# Patient Record
Sex: Male | Born: 1980 | Race: Black or African American | Hispanic: No | Marital: Single | Smoking: Current every day smoker
Health system: Southern US, Community
[De-identification: ages and names within clinical notes are randomized; demographics above are authoritative.]

## PROBLEM LIST (undated history)

## (undated) DIAGNOSIS — Z789 Other specified health status: Secondary | ICD-10-CM

## (undated) DIAGNOSIS — M5430 Sciatica, unspecified side: Secondary | ICD-10-CM

## (undated) HISTORY — DX: Other specified health status: Z78.9

## (undated) HISTORY — PX: WRIST SURGERY: SHX841

---

## 2000-03-22 ENCOUNTER — Emergency Department (HOSPITAL_COMMUNITY): Admission: EM | Admit: 2000-03-22 | Discharge: 2000-03-22 | Payer: Self-pay | Admitting: *Deleted

## 2000-03-22 ENCOUNTER — Encounter: Payer: Self-pay | Admitting: Emergency Medicine

## 2002-09-16 ENCOUNTER — Encounter: Payer: Self-pay | Admitting: *Deleted

## 2002-09-16 ENCOUNTER — Emergency Department (HOSPITAL_COMMUNITY): Admission: EM | Admit: 2002-09-16 | Discharge: 2002-09-16 | Payer: Self-pay | Admitting: *Deleted

## 2010-09-16 ENCOUNTER — Emergency Department (HOSPITAL_COMMUNITY)
Admission: EM | Admit: 2010-09-16 | Discharge: 2010-09-16 | Payer: Self-pay | Source: Home / Self Care | Admitting: Emergency Medicine

## 2010-09-23 LAB — GC/CHLAMYDIA PROBE AMP, GENITAL
Chlamydia, DNA Probe: NEGATIVE
GC Probe Amp, Genital: NEGATIVE

## 2014-09-13 ENCOUNTER — Encounter (HOSPITAL_COMMUNITY): Payer: Self-pay | Admitting: *Deleted

## 2014-09-13 ENCOUNTER — Emergency Department (HOSPITAL_COMMUNITY)
Admission: EM | Admit: 2014-09-13 | Discharge: 2014-09-14 | Disposition: A | Discharge status: Home / Self Care | Payer: Self-pay | Attending: Emergency Medicine | Admitting: Emergency Medicine

## 2014-09-13 DIAGNOSIS — Z720 Tobacco use: Secondary | ICD-10-CM | POA: Insufficient documentation

## 2014-09-13 DIAGNOSIS — N342 Other urethritis: Secondary | ICD-10-CM | POA: Insufficient documentation

## 2014-09-13 MED ORDER — CEFTRIAXONE SODIUM 250 MG IJ SOLR
250.0000 mg | Freq: Once | INTRAMUSCULAR | Status: AC
  Administered 2014-09-13: 250 mg via INTRAMUSCULAR
  Filled 2014-09-13: qty 250

## 2014-09-13 MED ORDER — DOXYCYCLINE HYCLATE 100 MG PO CAPS: 100.0000 mg | ORAL_CAPSULE | Freq: Two times a day (BID) | ORAL | Status: DC

## 2014-09-13 NOTE — ED Notes (Signed)
Pt states his partner states she has a STD and now he is having a discharge and burning with urination.

## 2014-09-13 NOTE — Discharge Instructions (Signed)

## 2014-09-13 NOTE — ED Provider Notes (Signed)
CSN: [id number]     Arrival date & time 09/13/14  2231 History  This chart was scribed for Loren Raceravid Kambrey Hagger, MD by Bronson CurbJacqueline Melvin, ED Scribe. This patient was seen in room APA07/APA07 and the patient's care was started at 11:38 PM.     Chief Complaint  Patient presents with  . Exposure to STD   The history is provided by the patient. No language interpreter was used.     HPI Comments: Taylor Mcclain is a 34 y.o. male, with no significant medical history, who presents to the Emergency Department for possible exposure to STD. Patient states he was sexually active with someone who was recently diagnosed with an STD. There is associated dysuria and penile discharge that has been ongoing for the past 5 days. He also notes mild right inguinal pain. No testicular pain or swelling. No fever or chills. He denies any other symptoms.  History reviewed. No pertinent past medical history. Past Surgical History  Procedure Laterality Date  . Orthopedic surgery     History reviewed. No pertinent family history. History  Substance Use Topics  . Smoking status: Current Every Day Smoker -- 0.50 packs/day  . Smokeless tobacco: Not on file  . Alcohol Use: Yes    Review of Systems  Constitutional: Negative for fever and chills.  Gastrointestinal: Negative for nausea, vomiting and abdominal pain.  Genitourinary: Positive for dysuria and discharge. Negative for frequency, hematuria, flank pain, penile swelling, scrotal swelling, penile pain and testicular pain.  Musculoskeletal: Negative for back pain.  All other systems reviewed and are negative.     Allergies  Review of patient's allergies indicates no known allergies.  Home Medications   Prior to Admission medications   Not on File   Triage Vitals: BP 112/67 mmHg  Pulse 79  Temp(Src) 98.7 F (37.1 C) (Oral)  Resp 18  Ht 5\' 11"  (1.803 m)  Wt 180 lb (81.647 kg)  BMI 25.12 kg/m2  SpO2 100%  Physical Exam  Constitutional: He is oriented  to person, place, and time. He appears well-developed and well-nourished. No distress.  HENT:  Head: Normocephalic and atraumatic.  Eyes: Conjunctivae and EOM are normal.  Neck: Neck supple. No tracheal deviation present.  Cardiovascular: Normal rate.   Pulmonary/Chest: Effort normal. No respiratory distress.  Abdominal: He exhibits no distension and no mass. There is no tenderness. There is no rebound and no guarding.  Genitourinary:  Right inguinal lymphadenopathy. Milky penile discharge. No testicular swelling or tenderness.  Musculoskeletal: Normal range of motion.  Neurological: He is alert and oriented to person, place, and time.  Skin: Skin is warm and dry.  Psychiatric: He has a normal mood and affect. His behavior is normal.  Nursing note and vitals reviewed.   ED Course  Procedures (including critical care time)  DIAGNOSTIC STUDIES: Oxygen Saturation is 100% on room air, normal by my interpretation.    COORDINATION OF CARE: At 2341 Discussed treatment plan with patient which includes ABX. Patient agrees.   Labs Review Labs Reviewed - No data to display  Imaging Review No results found.   EKG Interpretation None      MDM   Final diagnoses:  None   I personally performed the services described in this documentation, which was scribed in my presence. The recorded information has been reviewed and is accurate.  Patient has been advised to have all of his partners evaluated and treated. He has given return precautions and voiced understanding.   Loren Raceravid Saranne Crislip, MD 09/13/14  2359 

## 2014-12-17 ENCOUNTER — Encounter (HOSPITAL_COMMUNITY): Payer: Self-pay | Admitting: Emergency Medicine

## 2014-12-17 ENCOUNTER — Emergency Department (HOSPITAL_COMMUNITY): Payer: Self-pay

## 2014-12-17 ENCOUNTER — Emergency Department (HOSPITAL_COMMUNITY)
Admission: EM | Admit: 2014-12-17 | Discharge: 2014-12-17 | Disposition: A | Discharge status: Home / Self Care | Payer: Self-pay | Attending: Emergency Medicine | Admitting: Emergency Medicine

## 2014-12-17 DIAGNOSIS — IMO0002 Reserved for concepts with insufficient information to code with codable children: Secondary | ICD-10-CM

## 2014-12-17 DIAGNOSIS — S0990XA Unspecified injury of head, initial encounter: Secondary | ICD-10-CM

## 2014-12-17 DIAGNOSIS — S0101XA Laceration without foreign body of scalp, initial encounter: Secondary | ICD-10-CM | POA: Insufficient documentation

## 2014-12-17 DIAGNOSIS — T148XXA Other injury of unspecified body region, initial encounter: Secondary | ICD-10-CM

## 2014-12-17 DIAGNOSIS — Y998 Other external cause status: Secondary | ICD-10-CM | POA: Insufficient documentation

## 2014-12-17 DIAGNOSIS — Y9289 Other specified places as the place of occurrence of the external cause: Secondary | ICD-10-CM | POA: Insufficient documentation

## 2014-12-17 DIAGNOSIS — S60511A Abrasion of right hand, initial encounter: Secondary | ICD-10-CM | POA: Insufficient documentation

## 2014-12-17 DIAGNOSIS — T1490XA Injury, unspecified, initial encounter: Secondary | ICD-10-CM

## 2014-12-17 DIAGNOSIS — Z720 Tobacco use: Secondary | ICD-10-CM | POA: Insufficient documentation

## 2014-12-17 DIAGNOSIS — Y9389 Activity, other specified: Secondary | ICD-10-CM | POA: Insufficient documentation

## 2014-12-17 MED ORDER — OXYCODONE-ACETAMINOPHEN 5-325 MG PO TABS
1.0000 | ORAL_TABLET | Freq: Once | ORAL | Status: AC
  Administered 2014-12-17: 1 via ORAL
  Filled 2014-12-17: qty 1

## 2014-12-17 MED ORDER — HYDROCODONE-ACETAMINOPHEN 5-325 MG PO TABS: 2.0000 | ORAL_TABLET | ORAL | Status: DC | PRN

## 2014-12-17 MED ORDER — LIDOCAINE-EPINEPHRINE (PF) 2 %-1:200000 IJ SOLN
10.0000 mL | Freq: Once | INTRAMUSCULAR | Status: DC
  Filled 2014-12-17: qty 20
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
## 2014-12-17 NOTE — ED Notes (Signed)
Suture cart ready
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
## 2014-12-17 NOTE — Discharge Instructions (Signed)
Follow-up at your doctor's office, urgent care, or if necessary, this ER in 10 days for removal of staples.  Abrasion An abrasion is a cut or scrape of the skin. Abrasions do not extend through all layers of the skin and most heal within 10 days. It is important to care for your abrasion properly to prevent infection. CAUSES  Most abrasions are caused by falling on, or gliding across, the ground or other surface. When your skin rubs on something, the outer and inner layer of skin rubs off, causing an abrasion. DIAGNOSIS  Your caregiver will be able to diagnose an abrasion during a physical exam.  TREATMENT  Your treatment depends on how large and deep the abrasion is. Generally, your abrasion will be cleaned with water and a mild soap to remove any dirt or debris. An antibiotic ointment may be put over the abrasion to prevent an infection. A bandage (dressing) may be wrapped around the abrasion to keep it from getting dirty.  You may need a tetanus shot if:  You cannot remember when you had your last tetanus shot.  You have never had a tetanus shot.  The injury broke your skin. If you get a tetanus shot, your arm may swell, get red, and feel warm to the touch. This is common and not a problem. If you need a tetanus shot and you choose not to have one, there is a rare chance of getting tetanus. Sickness from tetanus can be serious.  HOME CARE INSTRUCTIONS   If a dressing was applied, change it at least once a day or as directed by your caregiver. If the bandage sticks, soak it off with warm water.   Wash the area with water and a mild soap to remove all the ointment 2 times a day. Rinse off the soap and pat the area dry with a clean towel.   Reapply any ointment as directed by your caregiver. This will help prevent infection and keep the bandage from sticking. Use gauze over the wound and under the dressing to help keep the bandage from sticking.   Change your dressing right away if it  becomes wet or dirty.   Only take over-the-counter or prescription medicines for pain, discomfort, or fever as directed by your caregiver.   Follow up with your caregiver within 24-48 hours for a wound check, or as directed. If you were not given a wound-check appointment, look closely at your abrasion for redness, swelling, or pus. These are signs of infection. SEEK IMMEDIATE MEDICAL CARE IF:   You have increasing pain in the wound.   You have redness, swelling, or tenderness around the wound.   You have pus coming from the wound.   You have a fever or persistent symptoms for more than 2-3 days.  You have a fever and your symptoms suddenly get worse.  You have a bad smell coming from the wound or dressing.  MAKE SURE YOU:   Understand these instructions.  Will watch your condition.  Will get help right away if you are not doing well or get worse. Document Released: 06/04/2005 Document Revised: 08/11/2012 Document Reviewed: 07/29/2011 [facility] Patient Information 2015 [place], [place]. This information is not intended to replace advice given to you by your health care provider. Make sure you discuss any questions you have with your health care provider.  Head Injury You have a head injury. Headaches and throwing up (vomiting) are common after a head injury. It should be easy to wake up  from sleeping. Sometimes you must stay in the hospital. Most problems happen within the first 24 hours. Side effects may occur up to 7-10 days after the injury.  WHAT ARE THE TYPES OF HEAD INJURIES? Head injuries can be as minor as a bump. Some head injuries can be more severe. More severe head injuries include:  A jarring injury to the brain (concussion).  A bruise of the brain (contusion). This mean there is bleeding in the brain that can cause swelling.  A cracked skull (skull fracture).  Bleeding in the brain that collects, clots, and forms a bump (hematoma). WHEN SHOULD I GET HELP  RIGHT AWAY?   You are confused or sleepy.  You cannot be woken up.  You feel sick to your stomach (nauseous) or keep throwing up (vomiting).  Your dizziness or unsteadiness is getting worse.  You have very bad, lasting headaches that are not helped by medicine. Take medicines only as told by your doctor.  You cannot use your arms or legs like normal.  You cannot walk.  You notice changes in the black spots in the center of the colored part of your eye (pupil).  You have clear or bloody fluid coming from your nose or ears.  You have trouble seeing. During the next 24 hours after the injury, you must stay with someone who can watch you. This person should get help right away (call 911 in the [place] if you start to shake and are not able to control it (have seizures), you pass out, or you are unable to wake up. HOW CAN I PREVENT A HEAD INJURY IN THE FUTURE?  Wear seat belts.  Wear a helmet while bike riding and playing sports like football.  Stay away from dangerous activities around the house. WHEN CAN I RETURN TO NORMAL ACTIVITIES AND ATHLETICS? See your doctor before doing these activities. You should not do normal activities or play contact sports until 1 week after the following symptoms have stopped:  Headache that does not go away.  Dizziness.  Poor attention.  Confusion.  Memory problems.  Sickness to your stomach or throwing up.  Tiredness.  Fussiness.  Bothered by bright lights or loud noises.  Anxiousness or depression.  Restless sleep. MAKE SURE YOU:   Understand these instructions.  Will watch your condition.  Will get help right away if you are not doing well or get worse. Document Released: 08/07/2008 Document Revised: 01/09/2014 Document Reviewed: 05/02/2013 [facility] Patient Information 2015 [place], [place]. This information is not intended to replace advice given to you by your health care provider. Make sure you discuss any questions you have  with your health care provider.  Laceration Care, Adult A laceration is a cut that goes through all layers of the skin. The cut goes into the tissue beneath the skin. HOME CARE For stitches (sutures) or staples:  Keep the cut clean and dry.  If you have a bandage (dressing), change it at least once a day. Change the bandage if it gets wet or dirty, or as told by your doctor.  Wash the cut with soap and water 2 times a day. Rinse the cut with water. Pat it dry with a clean towel.  Put a thin layer of medicated cream on the cut as told by your doctor.  You may shower after the first 24 hours. Do not soak the cut in water until the stitches are removed.  Only take medicines as told by your doctor.  Have your stitches  or staples removed as told by your doctor. For skin adhesive strips:  Keep the cut clean and dry.  Do not get the strips wet. You may take a bath, but be careful to keep the cut dry.  If the cut gets wet, pat it dry with a clean towel.  The strips will fall off on their own. Do not remove the strips that are still stuck to the cut. For wound glue:  You may shower or take baths. Do not soak or scrub the cut. Do not swim. Avoid heavy sweating until the glue falls off on its own. After a shower or bath, pat the cut dry with a clean towel.  Do not put medicine on your cut until the glue falls off.  If you have a bandage, do not put tape over the glue.  Avoid lots of sunlight or tanning lamps until the glue falls off. Put sunscreen on the cut for the first year to reduce your scar.  The glue will fall off on its own. Do not pick at the glue. You may need a tetanus shot if:  You cannot remember when you had your last tetanus shot.  You have never had a tetanus shot. If you need a tetanus shot and you choose not to have one, you may get tetanus. Sickness from tetanus can be serious. GET HELP RIGHT AWAY IF:   Your pain does not get better with medicine.  Your arm,  hand, leg, or foot loses feeling (numbness) or changes color.  Your cut is bleeding.  Your joint feels weak, or you cannot use your joint.  You have painful lumps on your body.  Your cut is red, puffy (swollen), or painful.  You have a red line on the skin near the cut.  You have yellowish-white fluid (pus) coming from the cut.  You have a fever.  You have a bad smell coming from the cut or bandage.  Your cut breaks open before or after stitches are removed.  You notice something coming out of the cut, such as wood or glass.  You cannot move a finger or toe. MAKE SURE YOU:   Understand these instructions.  Will watch your condition.  Will get help right away if you are not doing well or get worse. Document Released: 02/11/2008 Document Revised: 11/17/2011 Document Reviewed: 02/18/2011 [facility] Patient Information 2015 [place], [place]. This information is not intended to replace advice given to you by your health care provider. Make sure you discuss any questions you have with your health care provider.

## 2014-12-27 ENCOUNTER — Emergency Department (HOSPITAL_COMMUNITY)
Admission: EM | Admit: 2014-12-27 | Discharge: 2014-12-27 | Disposition: A | Discharge status: Home / Self Care | Payer: Self-pay | Attending: Emergency Medicine | Admitting: Emergency Medicine

## 2014-12-27 ENCOUNTER — Encounter (HOSPITAL_COMMUNITY): Payer: Self-pay

## 2014-12-27 DIAGNOSIS — Z4802 Encounter for removal of sutures: Secondary | ICD-10-CM | POA: Insufficient documentation

## 2014-12-27 DIAGNOSIS — Z720 Tobacco use: Secondary | ICD-10-CM | POA: Insufficient documentation

## 2014-12-27 NOTE — ED Provider Notes (Signed)
CSN: [id number]     Arrival date & time 12/27/14  0831 History   First MD Initiated Contact with Patient 12/27/14 [phone]     Chief Complaint  Patient presents with  . Suture / Staple Removal     (Consider location/radiation/quality/duration/timing/severity/associated sxs/prior Treatment) Patient is a 34 y.o. male presenting with suture removal. The history is provided by the patient.  Suture / Staple Removal This is a new problem.   Taylor Mcclain is a 34 y.o. male who presents to the ED for staple removal from his scalp. Staples were placed 12/17/14. Patient denies any problems.   History reviewed. No pertinent past medical history. Past Surgical History  Procedure Laterality Date  . Orthopedic surgery     No family history on file. History  Substance Use Topics  . Smoking status: Current Every Day Smoker -- 0.25 packs/day for 18 years  . Smokeless tobacco: Not on file  . Alcohol Use: Yes     Comment: Occasionally    Review of Systems Negative except as stated in HPI   Allergies  Review of patient's allergies indicates no known allergies.  Home Medications   Prior to Admission medications   Medication Sig Start Date End Date Taking? Authorizing Provider  HYDROcodone-acetaminophen (NORCO/VICODIN) 5-325 MG per tablet Take 2 tablets by mouth every 4 (four) hours as needed for moderate pain. 12/17/14   Gilda Creasehristopher J Pollina, MD   BP 121/69 mmHg  Pulse 82  Temp(Src) 98.2 F (36.8 C) (Oral)  Resp 20  Ht 5\' 11"  (1.803 m)  Wt 180 lb (81.647 kg)  BMI 25.12 kg/m2  SpO2 100% Physical Exam  Constitutional: He is oriented to person, place, and time. He appears well-developed and well-nourished.  HENT:  Head: Normocephalic.  Staples in place scalp without signs of infection.   Eyes: EOM are normal.  Neck: Neck supple.  Cardiovascular: Normal rate.   Pulmonary/Chest: Effort normal.  Musculoskeletal: Normal range of motion.  Neurological: He is alert and oriented to person,  place, and time. No cranial nerve deficit.  Skin: Skin is warm and dry.  Psychiatric: He has a normal mood and affect. His behavior is normal.  Nursing note and vitals reviewed.   ED Course  Procedures Staples removed without difficulty MDM  34 y.o. male here for staple removal. No problems.  Final diagnoses:  Encounter for staple removal      Janne NapoleonHope M Wayden Schwertner, NP 12/28/14 1445  Benjiman CoreNathan Pickering, MD 12/29/14 [phone]

## 2014-12-27 NOTE — ED Notes (Signed)
Here for staple removal from head  

## 2014-12-27 NOTE — Discharge Instructions (Signed)
Staple Removal, Care After °The staples that were used to close your skin have been removed. The care described here will need to continue until the wound is completely healed and your health care provider confirms that wound care can be stopped. °HOME CARE INSTRUCTIONS  °· Keep the wound site dry and clean. Do not soak it in water. °· If skin adhesive strips were applied after the staples were removed, they will begin to peel off in a few days. Allow them to remain in place until they fall off on their own. °· If you still have a bandage (dressing), change it at least once a day or as directed by your health care provider. If the dressing sticks, pour warm, sterile water over it until it loosens and can be removed without pulling apart the wound edges. Pat dry with a clean towel. °· Apply cream or ointment that stops the growth of bacteria (antibacterial cream or ointment) only if your health care provider has directed you to do so. Place a nonstick bandage over the wound to prevent the dressing from sticking. °· Cover the nonstick bandage with a new dressing as directed by your health care provider. °· If the bandage becomes wet, dirty, or develops a bad smell, change it as soon as possible. °· New scars become sunburned easily. Use sunscreens with a sun protection factor (SPF) of at least 15 when out in the sun. Reapply the SPF every 2 hours. °· Only take medicines as directed by your health care provider. °SEEK IMMEDIATE MEDICAL CARE IF:  °· You have redness, swelling, or increasing pain in the wound. °· You have pus coming from the wound. °· You have a fever. °· You notice a bad smell coming from the wound or dressing. °· Your wound edges open up after staples have been removed. °MAKE SURE YOU:  °· Understand these instructions. °· Will watch your condition. °· Will get help right away if you are not doing well or get worse. °Document Released: 08/07/2008 Document Revised: 08/30/2013 Document Reviewed:  08/07/2008 °ExitCare® Patient Information ©2015 [employer]. This information is not intended to replace advice given to you by your health care provider. Make sure you discuss any questions you have with your health care provider. ° °

## 2015-06-13 ENCOUNTER — Emergency Department (HOSPITAL_COMMUNITY)
Admission: EM | Admit: 2015-06-13 | Discharge: 2015-06-13 | Disposition: A | Discharge status: Home / Self Care | Payer: Self-pay | Attending: Emergency Medicine | Admitting: Emergency Medicine

## 2015-06-13 ENCOUNTER — Encounter (HOSPITAL_COMMUNITY): Payer: Self-pay | Admitting: Emergency Medicine

## 2015-06-13 DIAGNOSIS — L309 Dermatitis, unspecified: Secondary | ICD-10-CM | POA: Insufficient documentation

## 2015-06-13 DIAGNOSIS — Z720 Tobacco use: Secondary | ICD-10-CM | POA: Insufficient documentation

## 2015-06-13 MED ORDER — PREDNISONE 10 MG PO TABS: ORAL_TABLET | ORAL | Status: DC

## 2015-06-13 MED ORDER — TRIAMCINOLONE ACETONIDE 0.1 % EX CREA
1.0000 | TOPICAL_CREAM | Freq: Two times a day (BID) | CUTANEOUS | Status: DC
Start: 2015-06-13 — End: 2018-01-19

## 2015-06-13 NOTE — ED Notes (Signed)
Pt states that he has a rash on bilateral inner thighs.  Started yesterday with itching.

## 2015-06-13 NOTE — ED Provider Notes (Signed)
CSN: [id number]     Arrival date & time 06/13/15  4098 History   First MD Initiated Contact with Patient 06/13/15 [phone]     Chief Complaint  Patient presents with  . Rash     (Consider location/radiation/quality/duration/timing/severity/associated sxs/prior Treatment) Patient is a 34 y.o. male presenting with rash. The history is provided by the patient.  Rash Location:  Leg Leg rash location:  L leg and R leg Quality: dryness and itchiness   Quality: not draining and not weeping   Severity:  Moderate Onset quality:  Gradual Duration:  1 day Timing:  Intermittent Progression:  Worsening Chronicity:  Recurrent Context: not hot tub use, not medications, not new detergent/soap and not plant contact   Relieved by: partial relief with OTC steoid cream. Worsened by:  Nothing tried Associated symptoms: URI   Associated symptoms: no fever, no nausea, no throat swelling and no tongue swelling     History reviewed. No pertinent past medical history. Past Surgical History  Procedure Laterality Date  . Orthopedic surgery     History reviewed. No pertinent family history. Social History  Substance Use Topics  . Smoking status: Current Every Day Smoker -- 1.00 packs/day for 18 years    Types: Cigarettes  . Smokeless tobacco: None  . Alcohol Use: Yes     Comment: Occasionally    Review of Systems  Constitutional: Negative for fever.  Gastrointestinal: Negative for nausea.  Skin: Positive for rash.  All other systems reviewed and are negative.     Allergies  Review of patient's allergies indicates no known allergies.  Home Medications   Prior to Admission medications   Medication Sig Start Date End Date Taking? Authorizing Provider  HYDROcodone-acetaminophen (NORCO/VICODIN) 5-325 MG per tablet Take 2 tablets by mouth every 4 (four) hours as needed for moderate pain. 12/17/14   Gilda Crease, MD   BP 110/89 mmHg  Pulse 80  Temp(Src) 98.7 F (37.1 C) (Oral)  Resp  16  Ht  (1.803 m)  Wt 190 lb (86.183 kg)  BMI 26.51 kg/m2  SpO2 100% Physical Exam  Constitutional: He is oriented to person, place, and time. He appears well-developed and well-nourished.  Non-toxic appearance.  HENT:  Head: Normocephalic.  Right Ear: Tympanic membrane and external ear normal.  Left Ear: Tympanic membrane and external ear normal.  Eyes: EOM and lids are normal. Pupils are equal, round, and reactive to light.  Neck: Normal range of motion. Neck supple. Carotid bruit is not present.  Cardiovascular: Normal rate, regular rhythm, normal heart sounds, intact distal pulses and normal pulses.   Pulmonary/Chest: Breath sounds normal. No respiratory distress.  Abdominal: Soft. Bowel sounds are normal. There is no tenderness. There is no guarding.  Musculoskeletal: Normal range of motion.  Lymphadenopathy:       Head (right side): No submandibular adenopathy present.       Head (left side): No submandibular adenopathy present.    He has no cervical adenopathy.  Neurological: He is alert and oriented to person, place, and time. He has normal strength. No cranial nerve deficit or sensory deficit.  Skin: Skin is warm and dry. Rash noted.  There is a small remnant of dry red maculopapular rash on the inner aspect of the ulna right and left thigh, extending to the area behind the knee.  Psychiatric: He has a normal mood and affect. His speech is normal.  Nursing note and vitals reviewed.   ED Course  Procedures (including critical care time)  Labs Review Labs Reviewed - No data to display  Imaging Review No results found. I have personally reviewed and evaluated these images and lab results as part of my medical decision-making.   EKG Interpretation None      MDM  Vital signs are well within normal limits. Patient had a mild upper respiratory infection a few days ago, now has a rash that he and his significant other noted on the inner aspect and back of the thighs.  Hydrocortisone cream was applied earlier, and most of the rash has resolved. The portion that his left suggest an atrophic dermatitis. Patient will be treated with a steroidal cream and sterilely tablet. The patient is referred to dermatology if not improving.    Final diagnoses:  None    **I have reviewed nursing notes, vital signs, and all appropriate lab and imaging results for this patient.Ivery Quale, PA-C 06/13/15 1610  Bethann Berkshire, MD 06/14/15 [phone]

## 2015-06-13 NOTE — Discharge Instructions (Signed)
Please use medications as ordered. Please use Zyrtec during the day for itching. Use Benadryl at night for itching if needed. Please see the dermatologist if not improving. Rash A rash is a change in the color or feel of your skin. There are many different types of rashes. You may have other problems along with your rash. HOME CARE  Avoid the thing that caused your rash.  Do not scratch your rash.  You may take cools baths to help stop itching.  Only take medicines as told by your doctor.  Keep all doctor visits as told. GET HELP RIGHT AWAY IF:   Your pain, puffiness (swelling), or redness gets worse.  You have a fever.  You have new or severe problems.  You have body aches, watery poop (diarrhea), or you throw up (vomit).  Your rash is not better after 3 days. MAKE SURE YOU:   Understand these instructions.  Will watch your condition.  Will get help right away if you are not doing well or get worse.   This information is not intended to replace advice given to you by your health care provider. Make sure you discuss any questions you have with your health care provider.   Document Released: 02/11/2008 Document Revised: 11/17/2011 Document Reviewed: 01/10/2015 Elsevier Interactive Patient Education [employer]

## 2016-08-07 ENCOUNTER — Encounter (HOSPITAL_COMMUNITY): Payer: Self-pay

## 2016-08-07 ENCOUNTER — Emergency Department (HOSPITAL_COMMUNITY): Payer: BLUE CROSS/BLUE SHIELD

## 2016-08-07 ENCOUNTER — Emergency Department (HOSPITAL_COMMUNITY)
Admission: EM | Admit: 2016-08-07 | Discharge: 2016-08-07 | Disposition: A | Discharge status: Home / Self Care | Payer: BLUE CROSS/BLUE SHIELD | Attending: Emergency Medicine | Admitting: Emergency Medicine

## 2016-08-07 DIAGNOSIS — S76211A Strain of adductor muscle, fascia and tendon of right thigh, initial encounter: Secondary | ICD-10-CM | POA: Diagnosis not present

## 2016-08-07 DIAGNOSIS — S0083XA Contusion of other part of head, initial encounter: Secondary | ICD-10-CM | POA: Diagnosis not present

## 2016-08-07 DIAGNOSIS — Y92410 Unspecified street and highway as the place of occurrence of the external cause: Secondary | ICD-10-CM | POA: Diagnosis not present

## 2016-08-07 DIAGNOSIS — Y939 Activity, unspecified: Secondary | ICD-10-CM | POA: Diagnosis not present

## 2016-08-07 DIAGNOSIS — Z79899 Other long term (current) drug therapy: Secondary | ICD-10-CM | POA: Diagnosis not present

## 2016-08-07 DIAGNOSIS — S0990XA Unspecified injury of head, initial encounter: Secondary | ICD-10-CM | POA: Diagnosis present

## 2016-08-07 DIAGNOSIS — Y999 Unspecified external cause status: Secondary | ICD-10-CM | POA: Insufficient documentation

## 2016-08-07 DIAGNOSIS — F17210 Nicotine dependence, cigarettes, uncomplicated: Secondary | ICD-10-CM | POA: Insufficient documentation

## 2016-08-07 MED ORDER — NAPROXEN 500 MG PO TABS
500.0000 mg | ORAL_TABLET | Freq: Two times a day (BID) | ORAL | 0 refills | Status: DC
Start: 2016-08-07 — End: 2018-01-19

## 2016-08-07 MED ORDER — ONDANSETRON HCL 4 MG/2ML IJ SOLN
4.0000 mg | Freq: Once | INTRAMUSCULAR | Status: AC
  Administered 2016-08-07: 4 mg via INTRAVENOUS
  Filled 2016-08-07: qty 2

## 2016-08-07 MED ORDER — METHOCARBAMOL 500 MG PO TABS: 1000.0000 mg | ORAL_TABLET | Freq: Four times a day (QID) | ORAL | 0 refills | Status: DC

## 2016-08-07 MED ORDER — KETOROLAC TROMETHAMINE 30 MG/ML IJ SOLN
30.0000 mg | Freq: Once | INTRAMUSCULAR | Status: AC
  Administered 2016-08-07: 30 mg via INTRAVENOUS
  Filled 2016-08-07: qty 1

## 2016-08-07 MED ORDER — HYDROMORPHONE HCL 2 MG/ML IJ SOLN
0.5000 mg | Freq: Once | INTRAMUSCULAR | Status: AC
  Administered 2016-08-07: 0.5 mg via INTRAVENOUS
  Filled 2016-08-07: qty 1

## 2016-08-07 NOTE — ED Notes (Signed)
Ambulated to the bathroom. (One assist). MD aware

## 2016-08-07 NOTE — ED Notes (Signed)
MD at the bedside  
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
## 2016-08-07 NOTE — ED Notes (Signed)
Transported to CT/xray
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
## 2016-08-11 ENCOUNTER — Emergency Department (HOSPITAL_COMMUNITY)
Admission: EM | Admit: 2016-08-11 | Discharge: 2016-08-11 | Disposition: A | Discharge status: Home / Self Care | Payer: BLUE CROSS/BLUE SHIELD | Attending: Emergency Medicine | Admitting: Emergency Medicine

## 2016-08-11 ENCOUNTER — Encounter (HOSPITAL_COMMUNITY): Payer: Self-pay | Admitting: *Deleted

## 2016-08-11 ENCOUNTER — Emergency Department (HOSPITAL_BASED_OUTPATIENT_CLINIC_OR_DEPARTMENT_OTHER)
Admit: 2016-08-11 | Discharge: 2016-08-11 | Disposition: A | Discharge status: Home / Self Care | Payer: BLUE CROSS/BLUE SHIELD | Attending: Student | Admitting: Student

## 2016-08-11 ENCOUNTER — Emergency Department (HOSPITAL_COMMUNITY): Payer: BLUE CROSS/BLUE SHIELD

## 2016-08-11 DIAGNOSIS — Y92410 Unspecified street and highway as the place of occurrence of the external cause: Secondary | ICD-10-CM | POA: Insufficient documentation

## 2016-08-11 DIAGNOSIS — S8991XA Unspecified injury of right lower leg, initial encounter: Secondary | ICD-10-CM | POA: Insufficient documentation

## 2016-08-11 DIAGNOSIS — M79609 Pain in unspecified limb: Secondary | ICD-10-CM | POA: Diagnosis not present

## 2016-08-11 DIAGNOSIS — F17210 Nicotine dependence, cigarettes, uncomplicated: Secondary | ICD-10-CM | POA: Diagnosis not present

## 2016-08-11 DIAGNOSIS — Y999 Unspecified external cause status: Secondary | ICD-10-CM | POA: Diagnosis not present

## 2016-08-11 DIAGNOSIS — Y939 Activity, unspecified: Secondary | ICD-10-CM | POA: Insufficient documentation

## 2016-08-11 DIAGNOSIS — M25561 Pain in right knee: Secondary | ICD-10-CM

## 2016-08-11 DIAGNOSIS — S76201A Unspecified injury of adductor muscle, fascia and tendon of right thigh, initial encounter: Secondary | ICD-10-CM | POA: Diagnosis not present

## 2016-08-11 DIAGNOSIS — R1031 Right lower quadrant pain: Secondary | ICD-10-CM

## 2016-08-11 MED ORDER — ACETAMINOPHEN 325 MG PO TABS
650.0000 mg | ORAL_TABLET | Freq: Once | ORAL | Status: AC
  Administered 2016-08-11: 650 mg via ORAL
  Filled 2016-08-11: qty 2

## 2016-08-11 NOTE — Discharge Instructions (Signed)
Please continue to ice, heat, rest, elevate your right leg. Apply ice and heat to your right groin. Continue taking the naproxen and Robaxin as prescribed on Thursday. He may also take Tylenol for the pain. Her ultrasound was negative for any blood clots. Your right knee x-ray shows a small amount of fluid around the kneecap. Please wear the knee sleeve to help with compression of the knee. Use the crutches as needed for comfort. Weightbearing as tolerated. I have given you a referral to the orthopedist if your symptoms do not improve in the next 5-6 days.I have also given you a financial resource guide for primary care docs in the area to follow up with if your symptoms do not improve.

## 2016-08-11 NOTE — ED Notes (Signed)
Declined W/C at D/C and was escorted to lobby by RN. 
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
## 2016-08-11 NOTE — Progress Notes (Signed)
VASCULAR LAB PRELIMINARY  PRELIMINARY  PRELIMINARY  PRELIMINARY  Right lower extremity venous duplex completed.    Preliminary report:  There is no DVT or SVT noted in the right lower extremity.   Called report to Azucena Kubayler Leaphart, PA-C  Philipp Callegari, RVT 08/11/2016, 12:27 PM
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
## 2018-01-19 ENCOUNTER — Emergency Department (HOSPITAL_COMMUNITY)
Admission: EM | Admit: 2018-01-19 | Discharge: 2018-01-19 | Disposition: A | Discharge status: Home / Self Care | Payer: Self-pay | Attending: Emergency Medicine | Admitting: Emergency Medicine

## 2018-01-19 ENCOUNTER — Other Ambulatory Visit: Payer: Self-pay

## 2018-01-19 ENCOUNTER — Encounter (HOSPITAL_COMMUNITY): Payer: Self-pay | Admitting: *Deleted

## 2018-01-19 ENCOUNTER — Emergency Department (HOSPITAL_COMMUNITY): Payer: Self-pay

## 2018-01-19 DIAGNOSIS — M79605 Pain in left leg: Secondary | ICD-10-CM

## 2018-01-19 DIAGNOSIS — Z79899 Other long term (current) drug therapy: Secondary | ICD-10-CM | POA: Insufficient documentation

## 2018-01-19 DIAGNOSIS — F17210 Nicotine dependence, cigarettes, uncomplicated: Secondary | ICD-10-CM | POA: Insufficient documentation

## 2018-01-19 DIAGNOSIS — M5136 Other intervertebral disc degeneration, lumbar region: Secondary | ICD-10-CM | POA: Insufficient documentation

## 2018-01-19 MED ORDER — ACETAMINOPHEN 325 MG PO TABS
650.0000 mg | ORAL_TABLET | Freq: Once | ORAL | Status: AC
  Administered 2018-01-19: 650 mg via ORAL
  Filled 2018-01-19: qty 2

## 2018-01-19 MED ORDER — CYCLOBENZAPRINE HCL 10 MG PO TABS: 10.0000 mg | ORAL_TABLET | Freq: Three times a day (TID) | ORAL | 0 refills | Status: DC

## 2018-01-19 MED ORDER — DEXAMETHASONE 4 MG PO TABS: 4.0000 mg | ORAL_TABLET | Freq: Two times a day (BID) | ORAL | 0 refills | Status: DC

## 2018-01-19 MED ORDER — IBUPROFEN 800 MG PO TABS
800.0000 mg | ORAL_TABLET | Freq: Once | ORAL | Status: AC
  Administered 2018-01-19: 800 mg via ORAL
  Filled 2018-01-19: qty 1

## 2018-01-19 NOTE — ED Triage Notes (Signed)
Pt c/o pain that starts in the left buttocks and shoots down into the left thigh and left shin x 3-4 months, worse with ambulation.

## 2018-01-19 NOTE — Discharge Instructions (Signed)
You have some degenerative disc disease at the L4-L5 area.  The x-ray of your pelvis and your left hip are both within normal limits.  Please see Dr. Romeo Apple for orthopedic evaluation concerning your back and lumbar area.  Please use 600 mg of ibuprofen with breakfast, lunch, dinner, and at bedtime.  Please use Decadron 2 times daily with food.  Use Flexeril 3 times daily for spasm pain when possible.  Flexeril will cause drowsiness.  Please do not drive a vehicle, operate machinery, drink alcohol, or participate in activities requiring concentration when taking this medication.
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
## 2018-01-28 ENCOUNTER — Ambulatory Visit: Payer: Self-pay | Admitting: Orthopaedic Surgery

## 2018-01-28 ENCOUNTER — Encounter: Payer: Self-pay | Admitting: Orthopaedic Surgery

## 2018-01-28 VITALS — BP 121/71 | HR 92 | Ht 71.0 in | Wt 190.0 lb

## 2018-01-28 DIAGNOSIS — M5442 Lumbago with sciatica, left side: Secondary | ICD-10-CM

## 2018-01-28 DIAGNOSIS — G8929 Other chronic pain: Secondary | ICD-10-CM

## 2018-01-28 MED ORDER — HYDROCODONE-ACETAMINOPHEN 5-325 MG PO TABS: ORAL_TABLET | ORAL | 0 refills | Status: DC

## 2018-01-28 MED ORDER — CYCLOBENZAPRINE HCL 10 MG PO TABS: 10.0000 mg | ORAL_TABLET | Freq: Three times a day (TID) | ORAL | 0 refills | Status: DC

## 2018-01-28 MED ORDER — NAPROXEN 500 MG PO TABS: 500.0000 mg | ORAL_TABLET | Freq: Two times a day (BID) | ORAL | 5 refills | Status: DC

## 2018-01-28 NOTE — Progress Notes (Signed)
Subjective:  Lower back hurts all the time    Patient ID: Taylor Mcclain, male    DOB: 02-Feb-1981, 37 y.o.   MRN: [id number]  HPI He has had lower back pain with left sided paresthesias for about three months.  He has no injury.  He works in [employer].  He has no weakness or numbness.  The pain runs down his left hip, posterior left thigh, past the knee to the left lateral foot.  It is getting worse.  He has tried Advil, heat, ice, rubs with no help.  He went to the ER on 01-19-18.  X-rays of the lumbar spine were done and were negative.  He was given Tramadol and flexeril and told to see me.    I have reviewed the ER records, the x-rays and the reports.  He is concerned that he is getting worse. He has pain with standing or sitting long periods of time.  He has not had much relief of his pain no matter what he does.  His work is labor intensive.   Review of Systems  Constitutional: Positive for activity change.  Musculoskeletal: Positive for arthralgias and back pain.  All other systems reviewed and are negative.  Past Medical History:  Diagnosis Date  . Known health problems: none     Past Surgical History:  Procedure Laterality Date  . CARPAL TUNNEL RELEASE Left   . ORTHOPEDIC SURGERY      Current Outpatient Medications on File Prior to Visit  Medication Sig Dispense Refill  . acetaminophen (TYLENOL) 325 MG tablet Take 650 mg by mouth every 6 (six) hours as needed.    . Multiple Vitamins-Minerals (MENS 50+ MULTI VITAMIN/MIN PO) Take 1 tablet by mouth daily.     No current facility-administered medications on file prior to visit.     Social History   Socioeconomic History  . Marital status: Single    Spouse name: Not on file  . Number of children: Not on file  . Years of education: Not on file  . Highest education level: Not on file  Occupational History  . Not on file  Social Needs  . Financial resource strain: Not on file  . Food insecurity:    Worry: Not on  file    Inability: Not on file  . Transportation needs:    Medical: Not on file    Non-medical: Not on file  Tobacco Use  . Smoking status: Current Every Day Smoker    Packs/day: 0.50    Years: 18.00    Pack years: 9.00    Types: Cigarettes  . Smokeless tobacco: Never Used  Substance and Sexual Activity  . Alcohol use: Yes    Comment: Occasionally  . Drug use: Yes    Types: Marijuana    Comment: last used a few days ago s of 01/19/18  . Sexual activity: Yes    Birth control/protection: None  Lifestyle  . Physical activity:    Days per week: Not on file    Minutes per session: Not on file  . Stress: Not on file  Relationships  . Social connections:    Talks on phone: Not on file    Gets together: Not on file    Attends religious service: Not on file    Active member of club or organization: Not on file    Attends meetings of clubs or organizations: Not on file    Relationship status: Not on file  . Intimate partner violence:  Fear of current or ex partner: Not on file    Emotionally abused: Not on file    Physically abused: Not on file    Forced sexual activity: Not on file  Other Topics Concern  . Not on file  Social History Narrative  . Not on file    Family History  Problem Relation Age of Onset  . Cancer Mother   . Diabetes Father     BP 121/71   Pulse 92   Ht  (1.803 m)   Wt 190 lb (86.2 kg)   BMI 26.50 kg/m      Objective:   Physical Exam  Constitutional: He is oriented to person, place, and time. He appears well-developed and well-nourished.  HENT:  Head: Normocephalic and atraumatic.  Eyes: Pupils are equal, round, and reactive to light. Conjunctivae and EOM are normal.  Neck: Normal range of motion. Neck supple.  Cardiovascular: Normal rate, regular rhythm and intact distal pulses.  Pulmonary/Chest: Effort normal.  Abdominal: Soft.  Musculoskeletal:       Lumbar back: He exhibits decreased range of motion, tenderness and pain.        Back:  Neurological: He is alert and oriented to person, place, and time. He has normal reflexes. He displays normal reflexes. No cranial nerve deficit. He exhibits normal muscle tone. Coordination normal.  Skin: Skin is warm and dry.  Psychiatric: He has a normal mood and affect. His behavior is normal. Judgment and thought content normal.          Assessment & Plan:   Encounter Diagnosis  Name Primary?  . Chronic left-sided low back pain with left-sided sciatica Yes   He may need MRI of the lumbar spine.  I have told him about [facility].  I have given Rx for Naprosyn 500 po bid pc, Flexeril and a limited five day pain medicine.  I have reviewed the [place] Controlled Substance Reporting System web site prior to prescribing narcotic medicine for this patient.  Return in two weeks.  Exercise back sheet given.  Call if any problem.  Precautions discussed.   Electronically Signed Darreld Mclean, MD 5/23/201912:00 PM

## 2018-02-10 ENCOUNTER — Ambulatory Visit: Payer: Self-pay | Admitting: Orthopaedic Surgery

## 2018-02-11 ENCOUNTER — Ambulatory Visit: Payer: Self-pay | Admitting: Orthopaedic Surgery

## 2018-08-02 ENCOUNTER — Encounter: Payer: Self-pay | Admitting: Physician Assistant

## 2018-08-02 ENCOUNTER — Ambulatory Visit: Payer: Self-pay | Admitting: Physician Assistant

## 2018-08-02 VITALS — BP 110/68 | HR 91 | Temp 97.7°F | Ht 69.25 in | Wt 199.2 lb

## 2018-08-02 DIAGNOSIS — Z13220 Encounter for screening for lipoid disorders: Secondary | ICD-10-CM

## 2018-08-02 DIAGNOSIS — Z7689 Persons encountering health services in other specified circumstances: Secondary | ICD-10-CM

## 2018-08-02 DIAGNOSIS — M5442 Lumbago with sciatica, left side: Secondary | ICD-10-CM

## 2018-08-02 DIAGNOSIS — G8929 Other chronic pain: Secondary | ICD-10-CM

## 2018-08-02 DIAGNOSIS — Z131 Encounter for screening for diabetes mellitus: Secondary | ICD-10-CM

## 2018-08-02 DIAGNOSIS — F17200 Nicotine dependence, unspecified, uncomplicated: Secondary | ICD-10-CM | POA: Insufficient documentation

## 2018-08-02 DIAGNOSIS — F1290 Cannabis use, unspecified, uncomplicated: Secondary | ICD-10-CM

## 2018-08-02 NOTE — Progress Notes (Signed)
BP 110/68 (BP Location: Left Arm, Patient Position: Sitting, Cuff Size: Normal)   Pulse 91   Temp 97.7 F (36.5 C)   Ht 5' 9.25" (1.759 m)   Wt 199 lb 4 oz (90.4 kg)   SpO2 98%   BMI 29.21 kg/m    Subjective:    Patient ID: Taylor Mcclain, male    DOB: 19-Jul-1981, 37 y.o.   MRN: [id number]  HPI: Taylor Mcclain is a 37 y.o. male presenting on 08/02/2018 for New Patient (Initial Visit)   HPI  Pt hasn't worked in a few months due to back pain.  He has history working Holiday representative.  Pt was seen by dr Hilda Lias for this in May and suggested that pt may need MRI.    Pt has no other complaints  Relevant past medical, surgical, family and social history reviewed and updated as indicated. Interim medical history since our last visit reviewed. Allergies and medications reviewed and updated.   Current Outpatient Medications:  .  acetaminophen (TYLENOL) 325 MG tablet, Take 650 mg by mouth every 6 (six) hours as needed., Disp: , Rfl:  .  cyclobenzaprine (FLEXERIL) 10 MG tablet, Take 1 tablet (10 mg total) by mouth 3 (three) times daily., Disp: 40 tablet, Rfl: 0 .  Multiple Vitamins-Minerals (MENS 50+ MULTI VITAMIN/MIN PO), Take 1 tablet by mouth daily., Disp: , Rfl:  .  naproxen (NAPROSYN) 500 MG tablet, Take 1 tablet (500 mg total) by mouth 2 (two) times daily with a meal., Disp: 60 tablet, Rfl: 5    Current Outpatient Medications:  .  acetaminophen (TYLENOL) 325 MG tablet, Take 650 mg by mouth every 6 (six) hours as needed., Disp: , Rfl:  .  cyclobenzaprine (FLEXERIL) 10 MG tablet, Take 1 tablet (10 mg total) by mouth 3 (three) times daily., Disp: 40 tablet, Rfl: 0 .  Multiple Vitamins-Minerals (MENS 50+ MULTI VITAMIN/MIN PO), Take 1 tablet by mouth daily., Disp: , Rfl:  .  naproxen (NAPROSYN) 500 MG tablet, Take 1 tablet (500 mg total) by mouth 2 (two) times daily with a meal., Disp: 60 tablet, Rfl: 5  Review of Systems  Constitutional: Positive for fatigue. Negative for appetite  change, chills, diaphoresis, fever and unexpected weight change.  HENT: Positive for dental problem. Negative for congestion, drooling, ear pain, facial swelling, hearing loss, mouth sores, sneezing, sore throat, trouble swallowing and voice change.   Eyes: Negative for pain, discharge, redness, itching and visual disturbance.  Respiratory: Negative for cough, choking, shortness of breath and wheezing.   Cardiovascular: Negative for chest pain, palpitations and leg swelling.  Gastrointestinal: Negative for abdominal pain, blood in stool, constipation, diarrhea and vomiting.  Endocrine: Negative for cold intolerance, heat intolerance and polydipsia.  Genitourinary: Negative for decreased urine volume, dysuria and hematuria.  Musculoskeletal: Positive for arthralgias, back pain and gait problem.  Skin: Negative for rash.  Allergic/Immunologic: Negative for environmental allergies.  Neurological: Negative for seizures, syncope, light-headedness and headaches.  Hematological: Negative for adenopathy.  Psychiatric/Behavioral: Negative for agitation, dysphoric mood and suicidal ideas. The patient is not nervous/anxious.     Per HPI unless specifically indicated above     Objective:    BP 110/68 (BP Location: Left Arm, Patient Position: Sitting, Cuff Size: Normal)   Pulse 91   Temp 97.7 F (36.5 C)   Ht 5' 9.25" (1.759 m)   Wt 199 lb 4 oz (90.4 kg)   SpO2 98%   BMI 29.21 kg/m   Wt Readings from Last 3  Encounters:  08/02/18 199 lb 4 oz (90.4 kg)  01/28/18 190 lb (86.2 kg)  01/19/18 180 lb (81.6 kg)    Physical Exam  Constitutional: He is oriented to person, place, and time. He appears well-developed and well-nourished.  HENT:  Head: Normocephalic and atraumatic.  Mouth/Throat: Oropharynx is clear and moist. No oropharyngeal exudate.  Eyes: Pupils are equal, round, and reactive to light. Conjunctivae and EOM are normal.  Neck: Neck supple. No thyromegaly present.  Cardiovascular:  Normal rate and regular rhythm.  Pulmonary/Chest: Effort normal and breath sounds normal. He has no wheezes. He has no rales.  Abdominal: Soft. Bowel sounds are normal. He exhibits no mass. There is no hepatosplenomegaly. There is no tenderness.  Musculoskeletal: He exhibits no edema.  Lymphadenopathy:    He has no cervical adenopathy.  Neurological: He is alert and oriented to person, place, and time.  Skin: Skin is warm and dry. No rash noted.  Psychiatric: He has a normal mood and affect. His behavior is normal. Thought content normal.  Vitals reviewed.       Assessment & Plan:   Encounter Diagnoses  Name Primary?  . Encounter to establish care Yes  . Chronic left-sided low back pain with left-sided sciatica   . Screening cholesterol level   . Screening for diabetes mellitus   . Tobacco use disorder   . Marijuana use     -will get baseline Labs -will update xray (discussed MRI but pt doesn't want to get that done until he gets financial assistance) -pt is given application for [facility] -will refer to orthopedics after xray reviewed -recommended apap and ibu 1 tab each qid prn for pain -counseled smoking cessation -pt to follow up 6 weeks to make sure he has appointment with orthopedics scheduled.  RTO sooner prn

## 2018-08-03 ENCOUNTER — Other Ambulatory Visit (HOSPITAL_COMMUNITY)
Admission: RE | Admit: 2018-08-03 | Discharge: 2018-08-03 | Disposition: A | Discharge status: Home / Self Care | Payer: Self-pay | Source: Ambulatory Visit | Attending: Physician Assistant | Admitting: Physician Assistant

## 2018-08-03 ENCOUNTER — Ambulatory Visit (HOSPITAL_COMMUNITY)
Admission: RE | Admit: 2018-08-03 | Discharge: 2018-08-03 | Disposition: A | Discharge status: Home / Self Care | Payer: Self-pay | Source: Ambulatory Visit | Attending: Physician Assistant | Admitting: Physician Assistant

## 2018-08-03 ENCOUNTER — Other Ambulatory Visit: Payer: Self-pay | Admitting: Physician Assistant

## 2018-08-03 DIAGNOSIS — R69 Illness, unspecified: Secondary | ICD-10-CM | POA: Insufficient documentation

## 2018-08-03 DIAGNOSIS — M5442 Lumbago with sciatica, left side: Principal | ICD-10-CM

## 2018-08-03 DIAGNOSIS — G8929 Other chronic pain: Secondary | ICD-10-CM

## 2018-08-03 LAB — LIPID PANEL
Cholesterol: 167 mg/dL (ref 0–200)
HDL: 66 mg/dL (ref >40)
LDL Cholesterol: 89 mg/dL (ref 0–99)
TRIGLYCERIDES: 59 mg/dL (ref <150)
Total CHOL/HDL Ratio: 2.5 RATIO
VLDL: 12 mg/dL (ref 0–40)

## 2018-08-03 LAB — COMPREHENSIVE METABOLIC PANEL
ALT: 16 U/L (ref 0–44)
ANION GAP: 6 (ref 5–15)
AST: 18 U/L (ref 15–41)
Albumin: 4.2 g/dL (ref 3.5–5.0)
Alkaline Phosphatase: 42 U/L (ref 38–126)
BUN: 11 mg/dL (ref 6–20)
CHLORIDE: 106 mmol/L (ref 98–111)
CO2: 29 mmol/L (ref 22–32)
Calcium: 9.1 mg/dL (ref 8.9–10.3)
Creatinine, Ser: 1.24 mg/dL (ref 0.61–1.24)
GFR calc non Af Amer: >60 mL/min (ref >60)
Glucose, Bld: 84 mg/dL (ref 70–99)
Potassium: 4 mmol/L (ref 3.5–5.1)
SODIUM: 141 mmol/L (ref 135–145)
Total Bilirubin: 0.5 mg/dL (ref 0.3–1.2)
Total Protein: 7.2 g/dL (ref 6.5–8.1)

## 2018-08-03 LAB — HEMOGLOBIN A1C
Hgb A1c MFr Bld: 5.1 % (ref 4.8–5.6)
Mean Plasma Glucose: 99.67 mg/dL

## 2018-08-08 ENCOUNTER — Other Ambulatory Visit: Payer: Self-pay | Admitting: Physician Assistant

## 2018-08-08 DIAGNOSIS — M5442 Lumbago with sciatica, left side: Principal | ICD-10-CM

## 2018-08-08 DIAGNOSIS — G8929 Other chronic pain: Secondary | ICD-10-CM

## 2018-09-13 ENCOUNTER — Ambulatory Visit: Payer: Self-pay | Admitting: Physician Assistant

## 2018-09-29 ENCOUNTER — Encounter: Payer: Self-pay | Admitting: Physician Assistant

## 2019-06-28 ENCOUNTER — Ambulatory Visit (HOSPITAL_COMMUNITY)
Admission: EM | Admit: 2019-06-28 | Discharge: 2019-06-28 | Disposition: A | Discharge status: Home / Self Care | Payer: Self-pay | Attending: Family Medicine | Admitting: Family Medicine

## 2019-06-28 ENCOUNTER — Encounter (HOSPITAL_COMMUNITY): Payer: Self-pay

## 2019-06-28 DIAGNOSIS — R369 Urethral discharge, unspecified: Secondary | ICD-10-CM | POA: Insufficient documentation

## 2019-06-28 LAB — POCT URINALYSIS DIP (DEVICE)
Bilirubin Urine: NEGATIVE
Glucose, UA: NEGATIVE mg/dL
Hgb urine dipstick: NEGATIVE
Ketones, ur: NEGATIVE mg/dL
Leukocytes,Ua: NEGATIVE
Nitrite: NEGATIVE
Protein, ur: NEGATIVE mg/dL
Specific Gravity, Urine: 1.02 (ref 1.005–1.030)
Urobilinogen, UA: 0.2 mg/dL (ref 0.0–1.0)
pH: 7.5 (ref 5.0–8.0)

## 2019-06-28 MED ORDER — AZITHROMYCIN 250 MG PO TABS
ORAL_TABLET | ORAL | Status: AC
Start: 2019-06-28 — End: ?
  Filled 2019-06-28: qty 4

## 2019-06-28 MED ORDER — LIDOCAINE HCL (PF) 1 % IJ SOLN
INTRAMUSCULAR | Status: AC
Start: 2019-06-28 — End: ?
  Filled 2019-06-28: qty 2

## 2019-06-28 MED ORDER — CEFTRIAXONE SODIUM 250 MG IJ SOLR
250.0000 mg | Freq: Once | INTRAMUSCULAR | Status: AC
  Administered 2019-06-28: 15:00:00 250 mg via INTRAMUSCULAR

## 2019-06-28 MED ORDER — CEFTRIAXONE SODIUM 250 MG IJ SOLR
INTRAMUSCULAR | Status: AC
  Filled 2019-06-28: qty 250

## 2019-06-28 MED ORDER — AZITHROMYCIN 250 MG PO TABS
1000.0000 mg | ORAL_TABLET | Freq: Once | ORAL | Status: AC
  Administered 2019-06-28: 15:00:00 1000 mg via ORAL

## 2019-06-28 NOTE — Discharge Instructions (Addendum)

## 2019-06-28 NOTE — ED Provider Notes (Signed)
[facility]   [id number] 06/28/19 Arrival Time: 1320  ASSESSMENT & PLAN:  1. Penile discharge     Declines HIV/RPR testing.   Discharge Instructions     You have been given the following medications today for treatment of suspected gonorrhea and/or chlamydia:  cefTRIAXone (ROCEPHIN) injection 250 mg azithromycin (ZITHROMAX) tablet 1,000 mg  Even though we have treated you today, we have sent testing for sexually transmitted infections. We will notify you of any positive results once they are received. If required, we will prescribe any medications you might need.  Please refrain from all sexual activity for at least the next seven days.     Pending: Labs Reviewed  CYTOLOGY, (ORAL, ANAL, URETHRAL) ANCILLARY ONLY    Will notify of any positive results. Instructed to refrain from sexual activity for at least seven days.  Reviewed expectations re: course of current medical issues. Questions answered. Outlined signs and symptoms indicating need for more acute intervention. Patient verbalized understanding. After Visit Summary given.   SUBJECTIVE:  Taylor Mcclain is a 38 y.o. male who presents with complaint of penile discharge. Onset abrupt. First noticed approx 1 week ago. Describes discharge as thick and white/yellow. Denies: urinary frequency, hematuria, chills and sweats. Afebrile. No abdominal or pelvic pain. No n/v. No rashes or lesions. Reports that he is sexually active with multiple male partners (two) without regular condom use. OTC treatment: none. History of STI: "a long time ago".  ROS: As per HPI. All other systems negative.   OBJECTIVE:  Vitals:   06/28/19 1350  BP: 132/73  Pulse: 84  Resp: 17  Temp: (!) 97.5 F (36.4 C)  TempSrc: Tympanic  SpO2: 98%     General appearance: alert, cooperative, appears stated age and no distress Throat: lips, mucosa, and tongue normal; teeth and gums normal CV: RRR Lungs: CTAB Back: no CVA  tenderness; FROM at waist Abdomen: soft, non-tender GU: normal appearing genitalia Skin: warm and dry Psychological: alert and cooperative; normal mood and affect.    Labs Reviewed  CYTOLOGY, (ORAL, ANAL, URETHRAL) ANCILLARY ONLY    No Known Allergies  Past Medical History:  Diagnosis Date  . Known health problems: none    Family History  Problem Relation Age of Onset  . Cancer Mother        liver cancer  . Hypertension Mother   . Diabetes Father   . Hypertension Father    Social History   Socioeconomic History  . Marital status: Single    Spouse name: Not on file  . Number of children: Not on file  . Years of education: Not on file  . Highest education level: Not on file  Occupational History  . Not on file  Social Needs  . Financial resource strain: Not on file  . Food insecurity    Worry: Not on file    Inability: Not on file  . Transportation needs    Medical: Not on file    Non-medical: Not on file  Tobacco Use  . Smoking status: Current Every Day Smoker    Packs/day: 0.50    Years: 18.00    Pack years: 9.00    Types: Cigarettes  . Smokeless tobacco: Never Used  Substance and Sexual Activity  . Alcohol use: Yes    Comment: drinks about 3-4 beers about 3-4 times/ week   . Drug use: Yes    Types: Marijuana  . Sexual activity: Yes    Birth control/protection: None  Lifestyle  .  Physical activity    Days per week: Not on file    Minutes per session: Not on file  . Stress: Not on file  Relationships  . Social Herbalist on phone: Not on file    Gets together: Not on file    Attends religious service: Not on file    Active member of club or organization: Not on file    Attends meetings of clubs or organizations: Not on file    Relationship status: Not on file  . Intimate partner violence    Fear of current or ex partner: Not on file    Emotionally abused: Not on file    Physically abused: Not on file    Forced sexual activity: Not on  file  Other Topics Concern  . Not on file  Social History Narrative  . Not on file          Vanessa Kick, MD 06/28/19 [phone]

## 2019-06-28 NOTE — ED Triage Notes (Addendum)
Pt presents with complaints of tingling in his penis, dysuria and penile discharge x 1 week. Pt states that he came in contact with gonorrhea with one of his partners 2-3 weeks ago.

## 2019-06-29 LAB — CYTOLOGY, (ORAL, ANAL, URETHRAL) ANCILLARY ONLY
Chlamydia: NEGATIVE
Neisseria Gonorrhea: NEGATIVE
Trichomonas: NEGATIVE

## 2020-01-10 ENCOUNTER — Emergency Department (HOSPITAL_COMMUNITY)
Admission: EM | Admit: 2020-01-10 | Discharge: 2020-01-10 | Disposition: A | Discharge status: Home / Self Care | Payer: Self-pay | Attending: Emergency Medicine | Admitting: Emergency Medicine

## 2020-01-10 ENCOUNTER — Encounter (HOSPITAL_COMMUNITY): Payer: Self-pay | Admitting: Emergency Medicine

## 2020-01-10 ENCOUNTER — Other Ambulatory Visit: Payer: Self-pay

## 2020-01-10 DIAGNOSIS — Z79899 Other long term (current) drug therapy: Secondary | ICD-10-CM | POA: Insufficient documentation

## 2020-01-10 DIAGNOSIS — M67431 Ganglion, right wrist: Secondary | ICD-10-CM | POA: Insufficient documentation

## 2020-01-10 DIAGNOSIS — S30863A Insect bite (nonvenomous) of scrotum and testes, initial encounter: Secondary | ICD-10-CM | POA: Insufficient documentation

## 2020-01-10 DIAGNOSIS — Y9289 Other specified places as the place of occurrence of the external cause: Secondary | ICD-10-CM | POA: Insufficient documentation

## 2020-01-10 DIAGNOSIS — F17210 Nicotine dependence, cigarettes, uncomplicated: Secondary | ICD-10-CM | POA: Insufficient documentation

## 2020-01-10 DIAGNOSIS — Y9389 Activity, other specified: Secondary | ICD-10-CM | POA: Insufficient documentation

## 2020-01-10 DIAGNOSIS — W57XXXA Bitten or stung by nonvenomous insect and other nonvenomous arthropods, initial encounter: Secondary | ICD-10-CM | POA: Insufficient documentation

## 2020-01-10 DIAGNOSIS — M6740 Ganglion, unspecified site: Secondary | ICD-10-CM

## 2020-01-10 DIAGNOSIS — Y999 Unspecified external cause status: Secondary | ICD-10-CM | POA: Insufficient documentation

## 2020-01-10 MED ORDER — DOXYCYCLINE HYCLATE 100 MG PO CAPS: 100.0000 mg | ORAL_CAPSULE | Freq: Two times a day (BID) | ORAL | 0 refills | Status: DC

## 2020-01-10 NOTE — ED Triage Notes (Signed)
Pt c/o tick bite on his testicle. States he removed the tick but now there is a bump.

## 2020-01-10 NOTE — ED Notes (Signed)
Pt in bed, pt c/o a bump on his scrotum, denies drainage, no redness noted, bump is pea sized, pt also c/o bump on his R wrist, states that he has had it for awhile, but it has gotten bigger, bump is aprox grape sized.

## 2020-01-10 NOTE — ED Provider Notes (Signed)
[facility] EMERGENCY DEPARTMENT Provider Note   CSN: [id number] Arrival date & time: 01/10/20  2124     History Chief Complaint  Patient presents with  . Insect Bite    Taylor Mcclain is a 39 y.o. male.  Patient presents to the emergency department with complaints of a lump on his scrotum and a lump on his right wrist.  Right wrist lump has been present for a very long time, seems to have slowly enlarged.  There redness, drainage noted.  He denies injury.  Patient reports a lump on his scrotum that has been present for 24 hours since he removed the tick.  No other rash.  No fever.  No drainage.        Past Medical History:  Diagnosis Date  . Known health problems: none     Patient Active Problem List   Diagnosis Date Noted  . Tobacco use disorder 08/02/2018    Past Surgical History:  Procedure Laterality Date  . WRIST SURGERY Left    age 44       Family History  Problem Relation Age of Onset  . Cancer Mother        liver cancer  . Hypertension Mother   . Diabetes Father   . Hypertension Father     Social History   Tobacco Use  . Smoking status: Current Every Day Smoker    Packs/day: 0.50    Years: 18.00    Pack years: 9.00    Types: Cigarettes  . Smokeless tobacco: Never Used  Substance Use Topics  . Alcohol use: Yes    Comment: drinks about 3-4 beers about 3-4 times/ week   . Drug use: Yes    Types: Marijuana    Comment: last use 01/10/20    Home Medications Prior to Admission medications   Medication Sig Start Date End Date Taking? Authorizing Provider  acetaminophen (TYLENOL) 325 MG tablet Take 650 mg by mouth every 6 (six) hours as needed.    [provider]  cyclobenzaprine (FLEXERIL) 10 MG tablet Take 1 tablet (10 mg total) by mouth 3 (three) times daily. 01/28/18   Darreld Mclean, MD  doxycycline (VIBRAMYCIN) 100 MG capsule Take 1 capsule (100 mg total) by mouth 2 (two) times daily. 01/10/20   Taylor Crease, MD  Multiple  Vitamins-Minerals (MENS 50+ MULTI VITAMIN/MIN PO) Take 1 tablet by mouth daily.    [provider]  naproxen (NAPROSYN) 500 MG tablet Take 1 tablet (500 mg total) by mouth 2 (two) times daily with a meal. 01/28/18   Darreld Mclean, MD    Allergies    Patient has no known allergies.  Review of Systems   Review of Systems  Genitourinary: Positive for scrotal swelling. Negative for discharge, penile pain, penile swelling and testicular pain.  Neurological: Negative.   All other systems reviewed and are negative.   Physical Exam Updated Vital Signs BP 126/76 (BP Location: Right Arm)   Pulse 85   Temp 98.3 F (36.8 C) (Oral)   Resp 18   Ht 5\' 11"  (1.803 m)   Wt 86.2 kg   SpO2 95%   BMI 26.50 kg/m   Physical Exam Vitals and nursing note reviewed.  Constitutional:      Appearance: Normal appearance.  HENT:     Head: Atraumatic.  Genitourinary:    Comments: Non-tender lump scrotum, no erythema, no induration Musculoskeletal:     Cervical back: Neck supple.     Comments: 3 discrete nontender  nonfluctuant nonmobile lumps dorsal aspect of right wrist  Skin:    Findings: No rash.  Neurological:     Mental Status: He is alert.     ED Results / Procedures / Treatments   Labs (all labs ordered are listed, but only abnormal results are displayed) Labs Reviewed - No data to display  EKG None  Radiology No results found.  Procedures Procedures (including critical care time)  Medications Ordered in ED Medications - No data to display  ED Course  I have reviewed the triage vital signs and the nursing notes.  Pertinent labs & imaging results that were available during my care of the patient were reviewed by me and considered in my medical decision making (see chart for details).   Patient presents with 2 separate complaints.  Patient has obvious ganglion cysts on the back of his right wrist.  No evidence of infection.  No intervention or work-up necessary.  Will  refer to hand surgery.  Patient has had a lump on his scrotum in the area that he removed a tick 1 day ago.  No evidence of erythema chronicum migrans.  No other rash.  He is afebrile and appears well.  This appears to be local reaction from the tick bite but will empirically cover with doxycycline.  MDM Rules/Calculators/A&P                       Final Clinical Impression(s) / ED Diagnoses Final diagnoses:  Tick bite, initial encounter  Ganglion cyst    Rx / DC Orders ED Discharge Orders         Ordered    doxycycline (VIBRAMYCIN) 100 MG capsule  2 times daily     01/10/20 2325           Orpah Greek, MD 01/10/20 2325

## 2020-01-22 ENCOUNTER — Ambulatory Visit
Admission: EM | Admit: 2020-01-22 | Discharge: 2020-01-22 | Disposition: A | Discharge status: Home / Self Care | Payer: Self-pay | Attending: Emergency Medicine | Admitting: Emergency Medicine

## 2020-01-22 ENCOUNTER — Other Ambulatory Visit: Payer: Self-pay

## 2020-01-22 DIAGNOSIS — Z113 Encounter for screening for infections with a predominantly sexual mode of transmission: Secondary | ICD-10-CM

## 2020-01-22 DIAGNOSIS — R300 Dysuria: Secondary | ICD-10-CM

## 2020-01-22 LAB — POCT URINALYSIS DIP (MANUAL ENTRY)
Bilirubin, UA: NEGATIVE
Glucose, UA: NEGATIVE mg/dL
Ketones, POC UA: NEGATIVE mg/dL
Leukocytes, UA: NEGATIVE
Nitrite, UA: NEGATIVE
Spec Grav, UA: 1.025 (ref 1.010–1.025)
Urobilinogen, UA: 1 E.U./dL
pH, UA: 7 (ref 5.0–8.0)

## 2020-01-22 NOTE — ED Provider Notes (Signed)
[facility]   [id number] 01/22/20 Arrival Time: 9211   CC: CONCERN FOR STD  SUBJECTIVE:  Taylor Mcclain is a 39 y.o. male who presents requesting STI screening.  Currently asymptomatic.  Last unprotected sexual encounter 1 week ago.  Sexually active with 1 male partner over the past week.  Reports hx of STD in the past.  Complains of 1 episode of burning with urination last week, now resolve.  Denies fever, chills, nausea, vomiting, abdominal or pelvic pain, penile rashes or lesions, testicular swelling or pain.     ROS: As per HPI.  All other pertinent ROS negative.     Past Medical History:  Diagnosis Date  . Known health problems: none    Past Surgical History:  Procedure Laterality Date  . WRIST SURGERY Left    age 68   No Known Allergies No current facility-administered medications on file prior to encounter.   Current Outpatient Medications on File Prior to Encounter  Medication Sig Dispense Refill  . acetaminophen (TYLENOL) 325 MG tablet Take 650 mg by mouth every 6 (six) hours as needed.    . cyclobenzaprine (FLEXERIL) 10 MG tablet Take 1 tablet (10 mg total) by mouth 3 (three) times daily. 40 tablet 0  . Multiple Vitamins-Minerals (MENS 50+ MULTI VITAMIN/MIN PO) Take 1 tablet by mouth daily.    . naproxen (NAPROSYN) 500 MG tablet Take 1 tablet (500 mg total) by mouth 2 (two) times daily with a meal. 60 tablet 5   Social History   Socioeconomic History  . Marital status: Single    Spouse name: Not on file  . Number of children: Not on file  . Years of education: Not on file  . Highest education level: Not on file  Occupational History  . Not on file  Tobacco Use  . Smoking status: Current Every Day Smoker    Packs/day: 0.50    Years: 18.00    Pack years: 9.00    Types: Cigarettes  . Smokeless tobacco: Never Used  Substance and Sexual Activity  . Alcohol use: Yes    Comment: drinks about 3-4 beers about 3-4 times/ week   . Drug use: Yes   Types: Marijuana    Comment: last use 01/10/20  . Sexual activity: Yes    Birth control/protection: None  Other Topics Concern  . Not on file  Social History Narrative  . Not on file   Social Determinants of Health   Financial Resource Strain:   . Difficulty of Paying Living Expenses:   Food Insecurity:   . Worried About Charity fundraiser in the Last Year:   . Arboriculturist in the Last Year:   Transportation Needs:   . Film/video editor (Medical):   Marland Kitchen Lack of Transportation (Non-Medical):   Physical Activity:   . Days of Exercise per Week:   . Minutes of Exercise per Session:   Stress:   . Feeling of Stress :   Social Connections:   . Frequency of Communication with Friends and Family:   . Frequency of Social Gatherings with Friends and Family:   . Attends Religious Services:   . Active Member of Clubs or Organizations:   . Attends Archivist Meetings:   Marland Kitchen Marital Status:   Intimate Partner Violence:   . Fear of Current or Ex-Partner:   . Emotionally Abused:   Marland Kitchen Physically Abused:   . Sexually Abused:    Family History  Problem Relation Age  of Onset  . Cancer Mother        liver cancer  . Hypertension Mother   . Diabetes Father   . Hypertension Father     OBJECTIVE:  Vitals:   01/22/20 1322  BP: 111/73  Resp: 20  Temp: 98.4 F (36.9 C)  SpO2: 97%     General appearance: alert, NAD, appears stated age Head: NCAT Throat: lips, mucosa, and tongue normal; teeth and gums normal Lungs: CTA bilaterally without adventitious breath sounds Heart: regular rate and rhythm.   Back: no CVA tenderness Abdomen: soft, non-tender; bowel sounds normal; no guarding GU: deferred Skin: warm and dry Psychological:  Alert and cooperative. Agitated mood and affect.  LABS:  Results for orders placed or performed during the hospital encounter of 01/22/20  POCT urinalysis dipstick  Result Value Ref Range   Color, UA straw (A) yellow   Clarity, UA clear  clear   Glucose, UA negative negative mg/dL   Bilirubin, UA negative negative   Ketones, POC UA negative negative mg/dL   Spec Grav, UA 1.610 9.604 - 1.025   Blood, UA trace-intact (A) negative   pH, UA 7.0 5.0 - 8.0   Protein Ur, POC trace (A) negative mg/dL   Urobilinogen, UA 1.0 0.2 or 1.0 E.U./dL   Nitrite, UA Negative Negative   Leukocytes, UA Negative Negative    Labs Reviewed  POCT URINALYSIS DIP (MANUAL ENTRY) - Abnormal; Notable for the following components:      Result Value   Color, UA straw (*)    Blood, UA trace-intact (*)    Protein Ur, POC trace (*)    All other components within normal limits  CYTOLOGY, (ORAL, ANAL, URETHRAL) ANCILLARY ONLY    ASSESSMENT & PLAN:  1. Screening examination for venereal disease   2. Dysuria     No orders of the defined types were placed in this encounter.   Pending: Labs Reviewed  POCT URINALYSIS DIP (MANUAL ENTRY) - Abnormal; Notable for the following components:      Result Value   Color, UA straw (*)    Blood, UA trace-intact (*)    Protein Ur, POC trace (*)    All other components within normal limits  CYTOLOGY, (ORAL, ANAL, URETHRAL) ANCILLARY ONLY    Swab obtained.  We will follow up with you regarding the results of your test If tests are positive, please abstain from sexual activity until you and your partner(s) are treated Follow up with PCP Return here or go to ER if you have any new or worsening symptoms    Reviewed expectations re: course of current medical issues. Questions answered. Outlined signs and symptoms indicating need for more acute intervention. Patient verbalized understanding. After Visit Summary given.       Rennis Harding, PA-C 01/22/20 1405

## 2020-01-22 NOTE — Discharge Instructions (Signed)
Swab obtained.  We will follow up with you regarding the results of your test If tests are positive, please abstain from sexual activity until you and your partner(s) are treated Follow up with PCP Return here or go to ER if you have any new or worsening symptoms

## 2020-01-22 NOTE — ED Triage Notes (Signed)
Pt presents with c/o burning with urination wants std screening

## 2020-01-24 LAB — CYTOLOGY, (ORAL, ANAL, URETHRAL) ANCILLARY ONLY
Chlamydia: NEGATIVE
Comment: NEGATIVE
Comment: NEGATIVE
Comment: NORMAL
Neisseria Gonorrhea: NEGATIVE
Trichomonas: NEGATIVE

## 2020-10-31 ENCOUNTER — Encounter: Payer: Self-pay | Admitting: Emergency Medicine

## 2020-10-31 ENCOUNTER — Other Ambulatory Visit: Payer: Self-pay

## 2020-10-31 ENCOUNTER — Ambulatory Visit
Admission: EM | Admit: 2020-10-31 | Discharge: 2020-10-31 | Disposition: A | Discharge status: Home / Self Care | Payer: Self-pay | Attending: Physician Assistant | Admitting: Physician Assistant

## 2020-10-31 DIAGNOSIS — N4889 Other specified disorders of penis: Secondary | ICD-10-CM | POA: Insufficient documentation

## 2020-10-31 LAB — POCT URINALYSIS DIP (MANUAL ENTRY)
Bilirubin, UA: NEGATIVE
Blood, UA: NEGATIVE
Glucose, UA: NEGATIVE mg/dL
Ketones, POC UA: NEGATIVE mg/dL
Leukocytes, UA: NEGATIVE
Nitrite, UA: NEGATIVE
Protein Ur, POC: NEGATIVE mg/dL
Spec Grav, UA: >=1.03 — AB (ref 1.010–1.025)
Urobilinogen, UA: 0.2 E.U./dL
pH, UA: 7 (ref 5.0–8.0)

## 2020-10-31 NOTE — Discharge Instructions (Addendum)
Your test are pending.  If any of your test are positive, you made to return for treatemtn

## 2020-10-31 NOTE — ED Triage Notes (Signed)
Burning  urethra x 1 month and itching on thighs

## 2020-11-01 LAB — CYTOLOGY, (ORAL, ANAL, URETHRAL) ANCILLARY ONLY
Chlamydia: NEGATIVE
Comment: NEGATIVE
Comment: NEGATIVE
Comment: NORMAL
Neisseria Gonorrhea: NEGATIVE
Trichomonas: NEGATIVE

## 2020-11-01 NOTE — ED Provider Notes (Signed)
[facility]    CSN: [id number] Arrival date & time: 10/31/20  1725      History   Chief Complaint Chief Complaint  Patient presents with  . SEXUALLY TRANSMITTED DISEASE    HPI Taylor Mcclain is a 40 y.o. male.   Pt reports partner wants him checked for std's.  He reports she has had symptoms in the past. Pt reports occasion irritation   The history is provided by the patient. No language interpreter was used.    Past Medical History:  Diagnosis Date  . Known health problems: none     Patient Active Problem List   Diagnosis Date Noted  . Tobacco use disorder 08/02/2018    Past Surgical History:  Procedure Laterality Date  . WRIST SURGERY Left    age 35       Home Medications    Prior to Admission medications   Medication Sig Start Date End Date Taking? Authorizing Provider  acetaminophen (TYLENOL) 325 MG tablet Take 650 mg by mouth every 6 (six) hours as needed.    [provider]  cyclobenzaprine (FLEXERIL) 10 MG tablet Take 1 tablet (10 mg total) by mouth 3 (three) times daily. 01/28/18   Darreld Mclean, MD  Multiple Vitamins-Minerals (MENS 50+ MULTI VITAMIN/MIN PO) Take 1 tablet by mouth daily.    [provider]  naproxen (NAPROSYN) 500 MG tablet Take 1 tablet (500 mg total) by mouth 2 (two) times daily with a meal. 01/28/18   Darreld Mclean, MD    Family History Family History  Problem Relation Age of Onset  . Cancer Mother        liver cancer  . Hypertension Mother   . Diabetes Father   . Hypertension Father     Social History Social History   Tobacco Use  . Smoking status: Current Every Day Smoker    Packs/day: 0.50    Years: 18.00    Pack years: 9.00    Types: Cigarettes  . Smokeless tobacco: Never Used  Vaping Use  . Vaping Use: Never used  Substance Use Topics  . Alcohol use: Yes    Comment: drinks about 3-4 beers about 3-4 times/ week   . Drug use: Yes    Types: Marijuana    Comment: daily      Allergies   Patient has no known allergies.   Review of Systems Review of Systems  Gastrointestinal: Negative for abdominal pain.  Genitourinary: Negative for dysuria, frequency, penile discharge, penile pain, penile swelling, testicular pain and urgency.  All other systems reviewed and are negative.    Physical Exam Triage Vital Signs ED Triage Vitals  Enc Vitals Group     BP 10/31/20 1835 109/73     Pulse Rate 10/31/20 1835 60     Resp 10/31/20 1835 18     Temp 10/31/20 1835 98.6 F (37 C)     Temp Source 10/31/20 1835 Oral     SpO2 10/31/20 1835 98 %     Weight --      Height --      Head Circumference --      Peak Flow --      Pain Score 10/31/20 1833 0     Pain Loc --      Pain Edu? --      Excl. in GC? --    No data found.  Updated Vital Signs BP 109/73 (BP Location: Right Arm)   Pulse 60   Temp 98.6  F (37 C) (Oral)   Resp 18   SpO2 98%   Visual Acuity Right Eye Distance:   Left Eye Distance:   Bilateral Distance:    Right Eye Near:   Left Eye Near:    Bilateral Near:     Physical Exam Vitals and nursing note reviewed.  Constitutional:      Appearance: He is well-developed and well-nourished.  HENT:     Head: Normocephalic and atraumatic.  Eyes:     Conjunctiva/sclera: Conjunctivae normal.  Cardiovascular:     Rate and Rhythm: Normal rate and regular rhythm.     Heart sounds: No murmur heard.   Pulmonary:     Effort: Pulmonary effort is normal. No respiratory distress.     Breath sounds: Normal breath sounds.  Abdominal:     Palpations: Abdomen is soft.     Tenderness: There is no abdominal tenderness.  Musculoskeletal:        General: No edema.     Cervical back: Neck supple.  Skin:    General: Skin is warm.  Neurological:     Mental Status: He is alert.  Psychiatric:        Mood and Affect: Mood and affect normal.      UC Treatments / Results  Labs (all labs ordered are listed, but only abnormal results are  displayed) Labs Reviewed  POCT URINALYSIS DIP (MANUAL ENTRY) - Abnormal; Notable for the following components:      Result Value   Spec Grav, UA >=1.030 (*)    All other components within normal limits  CYTOLOGY, (ORAL, ANAL, URETHRAL) ANCILLARY ONLY    EKG   Radiology No results found.  Procedures Procedures (including critical care time)  Medications Ordered in UC Medications - No data to display  Initial Impression / Assessment and Plan / UC Course  I have reviewed the triage vital signs and the nursing notes.  Pertinent labs & imaging results that were available during my care of the patient were reviewed by me and considered in my medical decision making (see chart for details).     MDM:  GC/CT/TRIch pending Final Clinical Impressions(s) / UC Diagnoses   Final diagnoses:  Penile irritation     Discharge Instructions     Your test are pending.  If any of your test are positive, you made to return for treatemtn   ED Prescriptions    None     PDMP not reviewed this encounter.  An After Visit Summary was printed and given to the patient.    Elson Areas, [place] 11/01/20 1054

## 2021-02-13 ENCOUNTER — Ambulatory Visit: Payer: Self-pay | Admitting: Internal Medicine

## 2021-03-05 ENCOUNTER — Ambulatory Visit: Payer: Self-pay | Admitting: Internal Medicine

## 2021-03-20 ENCOUNTER — Ambulatory Visit: Payer: Self-pay | Admitting: Internal Medicine

## 2021-04-22 ENCOUNTER — Ambulatory Visit: Payer: Self-pay | Admitting: Internal Medicine

## 2021-08-07 ENCOUNTER — Emergency Department (HOSPITAL_COMMUNITY)
Admission: EM | Admit: 2021-08-07 | Discharge: 2021-08-07 | Disposition: A | Discharge status: Home / Self Care | Payer: 59 | Attending: Emergency Medicine | Admitting: Emergency Medicine

## 2021-08-07 ENCOUNTER — Other Ambulatory Visit: Payer: Self-pay

## 2021-08-07 ENCOUNTER — Encounter (HOSPITAL_COMMUNITY): Payer: Self-pay

## 2021-08-07 DIAGNOSIS — M545 Low back pain, unspecified: Secondary | ICD-10-CM | POA: Diagnosis present

## 2021-08-07 DIAGNOSIS — F17210 Nicotine dependence, cigarettes, uncomplicated: Secondary | ICD-10-CM | POA: Diagnosis not present

## 2021-08-07 MED ORDER — METHYLPREDNISOLONE SODIUM SUCC 125 MG IJ SOLR
125.0000 mg | Freq: Once | INTRAMUSCULAR | Status: AC
  Administered 2021-08-07: 125 mg via INTRAMUSCULAR
  Filled 2021-08-07: qty 2

## 2021-08-07 MED ORDER — DICLOFENAC SODIUM 75 MG PO TBEC: 75.0000 mg | DELAYED_RELEASE_TABLET | Freq: Two times a day (BID) | ORAL | 0 refills | Status: DC

## 2021-08-07 MED ORDER — KETOROLAC TROMETHAMINE 60 MG/2ML IM SOLN
60.0000 mg | Freq: Once | INTRAMUSCULAR | Status: AC
  Administered 2021-08-07: 60 mg via INTRAMUSCULAR
  Filled 2021-08-07: qty 2

## 2021-08-07 MED ORDER — CYCLOBENZAPRINE HCL 10 MG PO TABS: 10.0000 mg | ORAL_TABLET | Freq: Three times a day (TID) | ORAL | 0 refills | Status: DC

## 2021-08-07 NOTE — Discharge Instructions (Addendum)
Return if any problems.  Schedule to see the Orthopaedist for evaluation °

## 2021-08-07 NOTE — ED Notes (Signed)
Dc instructions and scripts reviewed with pt. No questions or concerns at this time. Will follow up as needed with pcp.
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
## 2021-08-08 NOTE — ED Provider Notes (Signed)
[facility] EMERGENCY DEPARTMENT Provider Note   CSN: [id number] Arrival date & time: 08/07/21  3748     History Chief Complaint  Patient presents with   Back Pain    Taylor Mcclain is a 40 y.o. male.  The history is provided by the patient. No language interpreter was used.  Back Pain Location:  Lumbar spine Quality:  Aching Radiates to:  Does not radiate Pain severity:  Moderate Pain is:  Worse during the day Onset quality:  Gradual Chronicity:  New Relieved by:  Nothing Worsened by:  Nothing Ineffective treatments:  None tried Associated symptoms: no numbness and no weakness   Risk factors: no steroid use       Past Medical History:  Diagnosis Date   Known health problems: none     Patient Active Problem List   Diagnosis Date Noted   Tobacco use disorder 08/02/2018    Past Surgical History:  Procedure Laterality Date   WRIST SURGERY Left    age 46       Family History  Problem Relation Age of Onset   Cancer Mother        liver cancer   Hypertension Mother    Diabetes Father    Hypertension Father     Social History   Tobacco Use   Smoking status: Every Day    Packs/day: 0.50    Years: 18.00    Pack years: 9.00    Types: Cigarettes   Smokeless tobacco: Never  Vaping Use   Vaping Use: Never used  Substance Use Topics   Alcohol use: Yes    Comment: drinks about 3-4 beers about 3-4 times/ week    Drug use: Yes    Types: Marijuana    Comment: daily    Home Medications Prior to Admission medications   Medication Sig Start Date End Date Taking? Authorizing Provider  diclofenac (VOLTAREN) 75 MG EC tablet Take 1 tablet (75 mg total) by mouth 2 (two) times daily. 08/07/21  Yes Elson Areas, PA-C  acetaminophen (TYLENOL) 325 MG tablet Take 650 mg by mouth every 6 (six) hours as needed.    [provider]  cyclobenzaprine (FLEXERIL) 10 MG tablet Take 1 tablet (10 mg total) by mouth 3 (three) times daily. 08/07/21   Elson Areas, PA-C  Multiple Vitamins-Minerals (MENS 50+ MULTI VITAMIN/MIN PO) Take 1 tablet by mouth daily.    [provider]  naproxen (NAPROSYN) 500 MG tablet Take 1 tablet (500 mg total) by mouth 2 (two) times daily with a meal. 01/28/18   Darreld Mclean, MD    Allergies    Patient has no known allergies.  Review of Systems   Review of Systems  Musculoskeletal:  Positive for back pain.  Neurological:  Negative for weakness and numbness.  All other systems reviewed and are negative.  Physical Exam Updated Vital Signs BP 115/80 (BP Location: Right Arm)   Pulse 88   Temp 98.1 F (36.7 C) (Oral)   Resp 18   Ht 5\' 11"  (1.803 m)   Wt 86.2 kg   SpO2 99%   BMI 26.50 kg/m   Physical Exam Vitals and nursing note reviewed.  Constitutional:      Appearance: He is well-developed.  HENT:     Head: Normocephalic.  Cardiovascular:     Rate and Rhythm: Normal rate.  Pulmonary:     Effort: Pulmonary effort is normal.  Abdominal:     General: There is no distension.  Musculoskeletal:        General: Normal range of motion.     Cervical back: Normal range of motion.     Comments: Tender lower right lumbar pain with movement  nv and ns intact   Skin:    General: Skin is warm.  Neurological:     General: No focal deficit present.     Mental Status: He is alert and oriented to person, place, and time.  Psychiatric:        Mood and Affect: Mood normal.    ED Results / Procedures / Treatments   Labs (all labs ordered are listed, but only abnormal results are displayed) Labs Reviewed - No data to display  EKG None  Radiology No results found.  Procedures Procedures   Medications Ordered in ED Medications  ketorolac (TORADOL) injection 60 mg (60 mg Intramuscular Given 08/07/21 1120)  methylPREDNISolone sodium succinate (SOLU-MEDROL) 125 mg/2 mL injection 125 mg (125 mg Intramuscular Given 08/07/21 1120)    ED Course  I have reviewed the triage vital signs and the  nursing notes.  Pertinent labs & imaging results that were available during my care of the patient were reviewed by me and considered in my medical decision making (see chart for details).    MDM Rules/Calculators/A&P                           MDM:  Pt given toradol and solumedrol.  Pt advised to schedule to see Orthopaedist for evaluation   Final Clinical Impression(s) / ED Diagnoses Final diagnoses:  Acute low back pain without sciatica, unspecified back pain laterality    Rx / DC Orders ED Discharge Orders          Ordered    cyclobenzaprine (FLEXERIL) 10 MG tablet  3 times daily        08/07/21 1306    diclofenac (VOLTAREN) 75 MG EC tablet  2 times daily        08/07/21 1306          An After Visit Summary was printed and given to the patient.    Elson Areas, [place] 08/08/21 1052    Gerhard Munch, MD 08/10/21 [phone]

## 2021-09-08 HISTORY — PX: MICRODISCECTOMY LUMBAR: SUR864

## 2021-09-19 ENCOUNTER — Ambulatory Visit: Payer: 59

## 2021-09-19 ENCOUNTER — Other Ambulatory Visit: Payer: Self-pay

## 2021-09-19 ENCOUNTER — Encounter: Payer: Self-pay | Admitting: Orthopaedic Surgery

## 2021-09-19 ENCOUNTER — Ambulatory Visit (INDEPENDENT_AMBULATORY_CARE_PROVIDER_SITE_OTHER): Payer: 59 | Admitting: Orthopaedic Surgery

## 2021-09-19 VITALS — BP 136/81 | HR 94 | Ht 71.0 in | Wt 190.2 lb

## 2021-09-19 DIAGNOSIS — G8929 Other chronic pain: Secondary | ICD-10-CM

## 2021-09-19 DIAGNOSIS — M5441 Lumbago with sciatica, right side: Secondary | ICD-10-CM | POA: Diagnosis not present

## 2021-09-19 MED ORDER — HYDROCODONE-ACETAMINOPHEN 5-325 MG PO TABS: ORAL_TABLET | ORAL | 0 refills | Status: DC

## 2021-09-19 MED ORDER — PREDNISONE 5 MG (21) PO TBPK: ORAL_TABLET | ORAL | 0 refills | Status: DC

## 2021-09-19 MED ORDER — CYCLOBENZAPRINE HCL 10 MG PO TABS: 10.0000 mg | ORAL_TABLET | Freq: Every day | ORAL | 0 refills | Status: DC

## 2021-09-19 MED ORDER — NAPROXEN 500 MG PO TABS: 500.0000 mg | ORAL_TABLET | Freq: Two times a day (BID) | ORAL | 5 refills | Status: DC

## 2021-09-19 NOTE — Progress Notes (Signed)
Subjective:    Patient ID: Taylor Mcclain, male    DOB: 10-26-1980, 41 y.o.   MRN: [id number]  HPI He has had lower back pain with right sided paresthesias since Thanksgiving 2022.  He went to the ER on 08-07-21 for this.  He was given diclofenac and pain medicine and Flexeril.  He is out of the pain medicine.  He is getting worse.  He had an episode of lower back pain in 2019 but not like this.  He has numbness to the lateral toes on the right.  Pain runs down the posterior leg and is very sharp.  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  He has no bladder or bowel problems.  He is not getting better and is very concerned.   Review of Systems  Constitutional:  Positive for activity change.  Musculoskeletal:  Positive for arthralgias and back pain.  All other systems reviewed and are negative. For Review of Systems, all other systems reviewed and are negative.  The following is a summary of the past history medically, past history surgically, known current medicines, social history and family history.  This information is gathered electronically by the computer from prior information and documentation.  I review this each visit and have found including this information at this point in the chart is beneficial and informative.   Past Medical History:  Diagnosis Date   Known health problems: none     Past Surgical History:  Procedure Laterality Date   WRIST SURGERY Left    age 70    Current Outpatient Medications on File Prior to Visit  Medication Sig Dispense Refill   acetaminophen (TYLENOL) 325 MG tablet Take 650 mg by mouth every 6 (six) hours as needed.     Multiple Vitamins-Minerals (MENS 50+ MULTI VITAMIN/MIN PO) Take 1 tablet by mouth daily.     No current facility-administered medications on file prior to visit.    Social History   Socioeconomic History   Marital status: Single    Spouse name: Not on file   Number of children: Not on file   Years of education: Not  on file   Highest education level: Not on file  Occupational History   Not on file  Tobacco Use   Smoking status: Every Day    Packs/day: 0.50    Years: 18.00    Pack years: 9.00    Types: Cigarettes   Smokeless tobacco: Never  Vaping Use   Vaping Use: Never used  Substance and Sexual Activity   Alcohol use: Yes    Comment: drinks about 3-4 beers about 3-4 times/ week    Drug use: Yes    Types: Marijuana    Comment: daily   Sexual activity: Yes    Birth control/protection: None  Other Topics Concern   Not on file  Social History Narrative   Not on file   Social Determinants of Health   Financial Resource Strain: Not on file  Food Insecurity: Not on file  Transportation Needs: Not on file  Physical Activity: Not on file  Stress: Not on file  Social Connections: Not on file  Intimate Partner Violence: Not on file    Family History  Problem Relation Age of Onset   Cancer Mother        liver cancer   Hypertension Mother    Diabetes Father    Hypertension Father     BP 136/81    Pulse 94    Ht  5\' 11"  (1.803 m)    Wt 190 lb 3.2 oz (86.3 kg)    BMI 26.53 kg/m   Body mass index is 26.53 kg/m.     Objective:   Physical Exam Vitals and nursing note reviewed. Exam conducted with a chaperone present.  Constitutional:      Appearance: He is well-developed.  HENT:     Head: Normocephalic and atraumatic.  Eyes:     Conjunctiva/sclera: Conjunctivae normal.     Pupils: Pupils are equal, round, and reactive to light.  Cardiovascular:     Rate and Rhythm: Normal rate and regular rhythm.  Pulmonary:     Effort: Pulmonary effort is normal.  Abdominal:     Palpations: Abdomen is soft.  Musculoskeletal:       Arms:     Cervical back: Normal range of motion and neck supple.  Skin:    General: Skin is warm and dry.  Neurological:     Mental Status: He is alert and oriented to person, place, and time.     Cranial Nerves: No cranial nerve deficit.     Motor: No  abnormal muscle tone.     Coordination: Coordination normal.     Deep Tendon Reflexes: Reflexes are normal and symmetric. Reflexes normal.  Psychiatric:        Behavior: Behavior normal.        Thought Content: Thought content normal.        Judgment: Judgment normal.  X-rays were done of the lumbar spine, reported separately.        Assessment & Plan:   Encounter Diagnosis  Name Primary?   Chronic right-sided low back pain with right-sided sciatica Yes   I am concerned about possible HNP of the lumbar spine, and I will get MRI.  He has not improved with over six weeks of treatment.  I will begin pain medicine.  I have reviewed the [place] web site prior to prescribing narcotic medicine for this patient.  I will continue his Flexeril but only at night.  I will give prednisone dose pack for this week and then after it is completed, begin Naprosyn 500 po bid pc.  Return in two weeks after he has the MRI. Stay out of work.  Call if any problem.  Precautions discussed.  Electronically Signed Sanjuana Kava, MD 1/12/202310:36 AM

## 2021-09-24 ENCOUNTER — Ambulatory Visit: Payer: 59 | Admitting: Orthopaedic Surgery

## 2021-09-25 ENCOUNTER — Other Ambulatory Visit: Payer: Self-pay

## 2021-09-25 ENCOUNTER — Ambulatory Visit (HOSPITAL_COMMUNITY)
Admission: RE | Admit: 2021-09-25 | Discharge: 2021-09-25 | Disposition: A | Discharge status: Home / Self Care | Payer: 59 | Source: Ambulatory Visit | Attending: Orthopaedic Surgery | Admitting: Orthopaedic Surgery

## 2021-09-25 DIAGNOSIS — G8929 Other chronic pain: Secondary | ICD-10-CM | POA: Diagnosis present

## 2021-09-25 DIAGNOSIS — M5441 Lumbago with sciatica, right side: Secondary | ICD-10-CM | POA: Diagnosis not present

## 2021-09-26 ENCOUNTER — Ambulatory Visit (INDEPENDENT_AMBULATORY_CARE_PROVIDER_SITE_OTHER): Payer: 59 | Admitting: Orthopaedic Surgery

## 2021-09-26 ENCOUNTER — Encounter: Payer: Self-pay | Admitting: Orthopaedic Surgery

## 2021-09-26 VITALS — Ht 71.0 in | Wt 190.0 lb

## 2021-09-26 DIAGNOSIS — M5441 Lumbago with sciatica, right side: Secondary | ICD-10-CM

## 2021-09-26 DIAGNOSIS — G8929 Other chronic pain: Secondary | ICD-10-CM

## 2021-09-26 MED ORDER — HYDROCODONE-ACETAMINOPHEN 5-325 MG PO TABS: ORAL_TABLET | ORAL | 0 refills | Status: DC

## 2021-09-26 NOTE — Progress Notes (Signed)
I still hurt bad.  His right lower leg has paresthesias from his back.  He is worse.  He had MRI which showed: IMPRESSION: Lumbar spondylosis, as outlined and with findings most notably as follows.   At L5-S1, there is mild-to-moderate disc degeneration. A sizable right center/subarticular caudally migrated disc extrusion contributes to severe right subarticular stenosis. The disc extrusion encroaches upon multiple descending right-sided nerve roots (most notably the descending right S1 nerve root). Mild/moderate bilateral neural foraminal narrowing.   At L4-L5, there is mild-to-moderate disc degeneration. Multifactorial mild relative left subarticular narrowing (without appreciable nerve root impingement). Bilateral neural foraminal narrowing (moderate right, moderate/severe left).   Mild lumbar dextrocurvature.  I have informed him of the findings.  I will have him see neurosurgery.  I have independently reviewed the MRI.    His lower back is tender, tight on the right, no spasm, SLR positive at 25 on right, NV intact, pain to move forward and extension.    Encounter Diagnosis  Name Primary?   Chronic right-sided low back pain with right-sided sciatica Yes   To neurosurgery. I have reviewed the [place] web site prior to prescribing narcotic medicine for this patient.  I have renewed his pain medicine.  Stay out of work.  Call if any problem.  Precautions discussed.  Electronically Signed Sanjuana Kava, MD 1/19/202310:39 AM

## 2021-10-03 ENCOUNTER — Ambulatory Visit: Payer: 59 | Admitting: Orthopaedic Surgery

## 2022-09-27 ENCOUNTER — Ambulatory Visit: Payer: Self-pay

## 2022-11-06 ENCOUNTER — Encounter: Payer: Self-pay | Admitting: Radiology

## 2023-03-09 DIAGNOSIS — Z5941 Food insecurity: Secondary | ICD-10-CM | POA: Diagnosis not present

## 2023-03-09 DIAGNOSIS — L918 Other hypertrophic disorders of the skin: Secondary | ICD-10-CM | POA: Diagnosis not present

## 2023-03-29 ENCOUNTER — Emergency Department (HOSPITAL_COMMUNITY)
Admission: EM | Admit: 2023-03-29 | Discharge: 2023-03-29 | Disposition: A | Discharge status: Home / Self Care | Payer: Medicaid Other | Attending: Emergency Medicine | Admitting: Emergency Medicine

## 2023-03-29 ENCOUNTER — Other Ambulatory Visit: Payer: Self-pay

## 2023-03-29 ENCOUNTER — Encounter (HOSPITAL_COMMUNITY): Payer: Self-pay

## 2023-03-29 DIAGNOSIS — K0889 Other specified disorders of teeth and supporting structures: Secondary | ICD-10-CM | POA: Diagnosis not present

## 2023-03-29 DIAGNOSIS — K047 Periapical abscess without sinus: Secondary | ICD-10-CM

## 2023-03-29 DIAGNOSIS — K029 Dental caries, unspecified: Secondary | ICD-10-CM | POA: Diagnosis not present

## 2023-03-29 MED ORDER — PENICILLIN V POTASSIUM 250 MG PO TABS
500.0000 mg | ORAL_TABLET | Freq: Once | ORAL | Status: AC
  Administered 2023-03-29: 500 mg via ORAL
  Filled 2023-03-29: qty 2

## 2023-03-29 MED ORDER — MELOXICAM 15 MG PO TABS: 15.0000 mg | ORAL_TABLET | Freq: Every day | ORAL | 0 refills | Status: AC

## 2023-03-29 MED ORDER — PENICILLIN V POTASSIUM 500 MG PO TABS: 500.0000 mg | ORAL_TABLET | Freq: Four times a day (QID) | ORAL | 0 refills | Status: AC

## 2023-03-29 MED ORDER — OXYCODONE-ACETAMINOPHEN 5-325 MG PO TABS
2.0000 | ORAL_TABLET | Freq: Once | ORAL | Status: AC
  Administered 2023-03-29: 2 via ORAL
  Filled 2023-03-29: qty 2

## 2023-03-29 MED ORDER — KETOROLAC TROMETHAMINE 60 MG/2ML IM SOLN
30.0000 mg | Freq: Once | INTRAMUSCULAR | Status: AC
  Administered 2023-03-29: 30 mg via INTRAMUSCULAR
  Filled 2023-03-29: qty 2

## 2023-03-29 NOTE — ED Triage Notes (Signed)
Pt arrived via POV c/o posterior right inferior molar pain, caused by a chipped tooth. Pt reports taking 400mg  Ibuprofen at home w/o relief X 2 days. Pt endorses sensitivity to hot/cold foods and difficulty chewing. Pt has right lateral tenderness to his throat.

## 2023-03-29 NOTE — ED Provider Notes (Signed)
[place] EMERGENCY DEPARTMENT AT [facility] Provider Note   CSN: [id number] Arrival date & time: 03/29/23  6440     History  Chief Complaint  Patient presents with   Dental Pain    Taylor Mcclain is a 42 y.o. male.  Here with a few days of right lower molar pain. No trouble swallowing or breathing. Some right neck swelling and pain though. No fevers. No dental appointment.    Dental Pain      Home Medications Prior to Admission medications   Medication Sig Start Date End Date Taking? Authorizing Provider  meloxicam (MOBIC) 15 MG tablet Take 1 tablet (15 mg total) by mouth daily for 10 days. 03/29/23 04/08/23 Yes [place] Yepiz, Barbara Cower, MD  penicillin v potassium (VEETID) 500 MG tablet Take 1 tablet (500 mg total) by mouth 4 (four) times daily for 10 days. 03/29/23 04/08/23 Yes Diago Haik, Barbara Cower, MD  acetaminophen (TYLENOL) 325 MG tablet Take 650 mg by mouth every 6 (six) hours as needed.    [provider]  cyclobenzaprine (FLEXERIL) 10 MG tablet Take 1 tablet (10 mg total) by mouth at bedtime. One tablet every night at bedtime as needed for spasm. 09/19/21   Darreld Mclean, MD  HYDROcodone-acetaminophen (NORCO/VICODIN) 5-325 MG tablet One tablet every four hours as needed for acute pain.  Limit of five days per [place] statue. 09/26/21   Darreld Mclean, MD  Multiple Vitamins-Minerals (MENS 50+ MULTI VITAMIN/MIN PO) Take 1 tablet by mouth daily.    [provider]  naproxen (NAPROSYN) 500 MG tablet Take 1 tablet (500 mg total) by mouth 2 (two) times daily with a meal. Start after you finish the prednisone, one tablet two times a day after a meal. 09/19/21   Darreld Mclean, MD  predniSONE (STERAPRED UNI-PAK 21 TAB) 5 MG (21) TBPK tablet Take 6 pills first day; 5 pills second day; 4 pills third day; 3 pills fourth day; 2 pills next day and 1 pill last day. 09/19/21   Darreld Mclean, MD      Allergies    Patient has no known allergies.    Review of Systems   Review  of Systems  Physical Exam Updated Vital Signs BP (!) 149/102 (BP Location: Right Arm)   Pulse 68   Temp 98.3 F (36.8 C)   Resp 16   Ht 5\' 11"  (1.803 m)   Wt 86 kg   SpO2 98%   BMI 26.44 kg/m  Physical Exam Vitals and nursing note reviewed.  Constitutional:      Appearance: He is well-developed.  HENT:     Head: Normocephalic and atraumatic.     Mouth/Throat:     Comments: Mild gum edema around right lower molars, no drainable abscess Dental caries Cardiovascular:     Rate and Rhythm: Normal rate.  Pulmonary:     Effort: Pulmonary effort is normal. No respiratory distress.  Abdominal:     General: There is no distension.  Musculoskeletal:        General: Normal range of motion.     Cervical back: Normal range of motion.  Neurological:     Mental Status: He is alert.     ED Results / Procedures / Treatments   Labs (all labs ordered are listed, but only abnormal results are displayed) Labs Reviewed - No data to display  EKG None  Radiology No results found.  Procedures Procedures    Medications Ordered in ED Medications  ketorolac (TORADOL) injection 30 mg (has  no administration in time range)  oxyCODONE-acetaminophen (PERCOCET/ROXICET) 5-325 MG per tablet 2 tablet (has no administration in time range)  penicillin v potassium (VEETID) tablet 500 mg (has no administration in time range)    ED Course/ Medical Decision Making/ A&P                             Medical Decision Making Risk Prescription drug management.   Likely dental infection. No e/o deep neck space infection or other complications. The neck discomfort is likely lymphadenopathy. Dental fu recommended. Abx started. Pain meds given.   Final Clinical Impression(s) / ED Diagnoses Final diagnoses:  Dental caries  Dental infection    Rx / DC Orders ED Discharge Orders          Ordered    penicillin v potassium (VEETID) 500 MG tablet  4 times daily        03/29/23 0618    meloxicam  (MOBIC) 15 MG tablet  Daily        03/29/23 0618              Reni Hausner, Barbara Cower, MD 03/29/23 [phone]

## 2023-04-02 ENCOUNTER — Other Ambulatory Visit: Payer: Self-pay | Admitting: *Deleted

## 2023-04-02 DIAGNOSIS — R1900 Intra-abdominal and pelvic swelling, mass and lump, unspecified site: Secondary | ICD-10-CM

## 2023-04-07 ENCOUNTER — Ambulatory Visit: Payer: Medicaid Other | Admitting: Surgery

## 2023-05-19 ENCOUNTER — Encounter: Payer: Self-pay | Admitting: Surgery

## 2023-05-19 ENCOUNTER — Ambulatory Visit (INDEPENDENT_AMBULATORY_CARE_PROVIDER_SITE_OTHER): Payer: Medicaid Other | Admitting: Surgery

## 2023-05-19 VITALS — BP 112/74 | HR 79 | Temp 98.2°F | Resp 14 | Ht 71.0 in | Wt 182.0 lb

## 2023-05-19 DIAGNOSIS — L918 Other hypertrophic disorders of the skin: Secondary | ICD-10-CM | POA: Diagnosis not present

## 2023-05-19 NOTE — Progress Notes (Signed)
[facility] History and Physical  Reason for Referral: Skin tag Referring Physician: Ginger Carne, PA  Chief Complaint   New Patient (Initial Visit)     Taylor Mcclain is a 42 y.o. male.  HPI: Patient presents for evaluation of a right lower extremity/hip skin tag.  About 2 to 3 years ago, he had a mole in the area which was scraped off while he was at work.  Since that time, he has had this growth from the area.  He denies any pain associated with it and denies any history of it ever draining.  He denies any other similar moles or skin tags anywhere else on his body.  He has no significant past medical history and denies taking any medications, including blood thinning medications.  He has surgical history significant for back surgeries and a left wrist surgery.  He smokes a pack of cigarettes about every 3 days.  He will socially drink alcohol and occasionally use marijuana.  Past Medical History:  Diagnosis Date   Known health problems: none     Past Surgical History:  Procedure Laterality Date   WRIST SURGERY Left    age 10    Family History  Problem Relation Age of Onset   Cancer Mother        liver cancer   Hypertension Mother    Diabetes Father    Hypertension Father     Social History   Tobacco Use   Smoking status: Every Day    Current packs/day: 0.50    Average packs/day: 0.5 packs/day for 18.0 years (9.0 ttl pk-yrs)    Types: Cigarettes   Smokeless tobacco: Never  Vaping Use   Vaping status: Never Used  Substance Use Topics   Alcohol use: Yes    Comment: drinks about 3-4 beers about 3-4 times/ week    Drug use: Yes    Types: Marijuana    Comment: daily    Medications: I have reviewed the patient's current medications. Allergies as of 05/19/2023   No Known Allergies      Medication List        Accurate as of May 19, 2023 11:00 AM. If you have any questions, ask your nurse or doctor.          STOP taking these  medications    acetaminophen 325 MG tablet Commonly known as: TYLENOL Stopped by: Lamira Borin A Kana Reimann   cyclobenzaprine 10 MG tablet Commonly known as: FLEXERIL Stopped by: Elisandra Deshmukh A Omya Winfield   HYDROcodone-acetaminophen 5-325 MG tablet Commonly known as: NORCO/VICODIN Stopped by: Rethel Sebek A Robbert Langlinais   MENS 50+ MULTI VITAMIN/MIN PO Stopped by: Gregoria Selvy A Lon Klippel   naproxen 500 MG tablet Commonly known as: NAPROSYN Stopped by: Lilliona Blakeney A Nakota Ackert   predniSONE 5 MG (21) Tbpk tablet Commonly known as: STERAPRED UNI-PAK 21 TAB Stopped by: Matti Minney A Aleksa Catterton       TAKE these medications    esomeprazole 20 MG capsule Commonly known as: NEXIUM Take 20 mg by mouth daily at 12 noon.         ROS:  Constitutional: negative for chills, fatigue, and fevers Eyes: negative for visual disturbance and pain Ears, nose, mouth, throat, and face: positive for sinus problems, negative for ear drainage and sore throat Respiratory: negative for cough, wheezing, and shortness of breath Cardiovascular: negative for chest pain and palpitations Gastrointestinal: negative for abdominal pain, nausea, reflux symptoms, and vomiting Genitourinary:negative for dysuria and frequency Integument/breast: negative for dryness and rash Hematologic/lymphatic: negative  for bleeding and lymphadenopathy Musculoskeletal:positive for back pain and neck pain Neurological: negative for dizziness and tremors Endocrine: negative for temperature intolerance  Blood pressure 112/74, pulse 79, temperature 98.2 F (36.8 C), temperature source Oral, resp. rate 14, height 5\' 11"  (1.803 m), weight 182 lb (82.6 kg), SpO2 96%. Physical Exam Vitals reviewed.  Constitutional:      Appearance: Normal appearance.  HENT:     Head: Normocephalic and atraumatic.  Cardiovascular:     Rate and Rhythm: Normal rate and regular rhythm.  Pulmonary:     Effort: Pulmonary effort is normal.     Breath  sounds: Normal breath sounds.  Musculoskeletal:        General: Normal range of motion.     Cervical back: Normal range of motion.  Skin:    Comments: Right anterior hip/thigh with broad-based skin tag, 2-1/2 to 3 cm in size, soft, nontender, no overlying mole or skin changes  Neurological:     Mental Status: He is alert.     Results: No results found for this or any previous visit (from the past 48 hour(s)).  No results found.   Assessment & Plan:  Taylor Mcclain is a 43 y.o. male who presents for evaluation of a right thigh/hip skin tag.  -We discussed the pathophysiology of skin tags.  Advised that if it is not causing him any symptoms, we do not need to excise the area at this time -I did explain to the patient that if the area begins to increase in size, starts causing him pain, has any type of color change associated with it, or becomes more hard to the touch, he needs to follow-up with to discuss excision at that time -Information provided to the patient regarding skin tags -Follow-up with me as needed  All questions were answered to the satisfaction of the patient.   Theophilus Kinds, DO [facility] [address] [place], [place] [address] [phone] (office)

## 2023-05-19 NOTE — Patient Instructions (Addendum)
If the area on your right hip increases in size, starts to hurt, changes color, or becomes more hard to the touch, please call to follow up with Dr. Robyne Peers

## 2023-08-13 DIAGNOSIS — M549 Dorsalgia, unspecified: Secondary | ICD-10-CM | POA: Diagnosis not present

## 2023-08-13 DIAGNOSIS — G629 Polyneuropathy, unspecified: Secondary | ICD-10-CM | POA: Diagnosis not present

## 2023-09-16 DIAGNOSIS — G629 Polyneuropathy, unspecified: Secondary | ICD-10-CM | POA: Diagnosis not present

## 2023-09-16 DIAGNOSIS — N189 Chronic kidney disease, unspecified: Secondary | ICD-10-CM | POA: Diagnosis not present

## 2023-09-16 DIAGNOSIS — M549 Dorsalgia, unspecified: Secondary | ICD-10-CM | POA: Diagnosis not present

## 2024-02-08 ENCOUNTER — Other Ambulatory Visit: Payer: Self-pay

## 2024-02-08 ENCOUNTER — Encounter (HOSPITAL_COMMUNITY): Payer: Self-pay

## 2024-02-08 ENCOUNTER — Ambulatory Visit (HOSPITAL_COMMUNITY): Attending: Family Medicine

## 2024-02-08 DIAGNOSIS — M5416 Radiculopathy, lumbar region: Secondary | ICD-10-CM | POA: Insufficient documentation

## 2024-02-08 DIAGNOSIS — M5459 Other low back pain: Secondary | ICD-10-CM | POA: Insufficient documentation
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
## 2024-02-15 ENCOUNTER — Encounter (HOSPITAL_COMMUNITY): Payer: Self-pay

## 2024-02-15 ENCOUNTER — Ambulatory Visit (HOSPITAL_COMMUNITY)

## 2024-02-15 DIAGNOSIS — M5459 Other low back pain: Secondary | ICD-10-CM | POA: Diagnosis not present

## 2024-02-15 DIAGNOSIS — M5416 Radiculopathy, lumbar region: Secondary | ICD-10-CM

## 2024-02-15 NOTE — Therapy (Signed)
 OUTPATIENT PHYSICAL THERAPY THORACOLUMBAR TREATMENT   Patient Name: Taylor Mcclain MRN: [id number] DOB:06/17/1981, 43 y.o., male Today's Date: 02/15/2024  END OF SESSION:  PT End of Session - 02/15/24 1055     Visit Number 2    Number of Visits 8    Date for PT Re-Evaluation 03/07/24    Authorization Type [place] [employer]    Authorization Time Period 8v from 02/08/24-04/08/24    Progress Note Due on Visit 8    PT Start Time 1018    PT Stop Time 1056    PT Time Calculation (min) 38 min    Activity Tolerance Patient tolerated treatment well    Behavior During Therapy WFL for tasks assessed/performed              Past Medical History:  Diagnosis Date   Known health problems: none    Past Surgical History:  Procedure Laterality Date   WRIST SURGERY Left    age 59   Patient Active Problem List   Diagnosis Date Noted   Tobacco use disorder 08/02/2018    PCP: Blondell Burgess, PA-C  REFERRING PROVIDER: Jonathon Neighbors, MD  REFERRING DIAG: M54.9 (ICD-10-CM) - Dorsalgia, unspecified  Rationale for Evaluation and Treatment: Rehabilitation  THERAPY DIAG:  Other low back pain  Radiculopathy, lumbar region  ONSET DATE: 7 years ago  SUBJECTIVE:                                                                                                                                                                                           SUBJECTIVE STATEMENT: Pt reporting that his pain is elevated this am. He is reporting 7-8/10 and cannot recall any particular movement or activity that made the pain worse.  PERTINENT HISTORY:  Back surgery 2 years ago  PAIN:  Are you having pain? Yes: NPRS scale: 9/10 Pain location: Low back and R LE Pain description: Soreness and throbbing pain Aggravating factors: Walking (very short bouts. Ie. From car to [employer] entrance) Relieving factors: Sometimes stretching, sometimes heat or ice   PRECAUTIONS: None  RED FLAGS: None   WEIGHT  BEARING RESTRICTIONS: No  FALLS:  Has patient fallen in last 6 months? No  OCCUPATION: Construction/Carpentry, Full time   PLOF: Independent  PATIENT GOALS: "Stop the pain"  NEXT MD VISIT: July 29th, 2025  OBJECTIVE:  Note: Objective measures were completed at Evaluation unless otherwise noted.  DIAGNOSTIC FINDINGS:  IMPRESSION: Lumbar spondylosis, as outlined and with findings most notably as follows.   At L5-S1, there is mild-to-moderate disc degeneration. A sizable right center/subarticular caudally migrated disc extrusion contributes to severe right subarticular stenosis.  The disc extrusion encroaches upon multiple descending right-sided nerve roots (most notably the descending right S1 nerve root). Mild/moderate bilateral neural foraminal narrowing.   At L4-L5, there is mild-to-moderate disc degeneration. Multifactorial mild relative left subarticular narrowing (without appreciable nerve root impingement). Bilateral neural foraminal narrowing (moderate right, moderate/severe left).   Mild lumbar dextrocurvature.  PATIENT SURVEYS:  Modified Oswestry Low Back Pain Disability Questionnaire: 31 / 50 = 62.0 %  COGNITION: Overall cognitive status: Within functional limits for tasks assessed     SENSATION: WFL  POSTURE: No Significant postural limitations  PALPATION: TTP throughout lumbar spine, Reports inc pain/discomfort with CPA to L1-L5 Palpation of R piriformis Inc sensation/discomfort down RLE Stiffness t/o thoracic spine Tightness t/o paraspinals, more notable in thoracic>lumbar   LUMBAR ROM:   AROM eval  Flexion WFL (just above toes), feels good like stretch  Extension 25% avail, * in low back and RLE  Right lateral flexion To knee joint, *  Left lateral flexion To knee joint, * worse than R  Right rotation 75% avail, *  Left rotation 75% avail,  * worse than R   (Blank rows = not tested)   *=painful  LOWER EXTREMITY ROM:     Active  Right eval  Left eval  Hip flexion    Hip extension    Hip abduction    Hip adduction    Hip internal rotation    Hip external rotation    Knee flexion    Knee extension    Ankle dorsiflexion    Ankle plantarflexion    Ankle inversion    Ankle eversion     (Blank rows = not tested)  LOWER EXTREMITY MMT:    MMT Right eval Left eval  Hip flexion 4- 5  Hip extension    Hip abduction    Hip adduction    Hip internal rotation    Hip external rotation    Knee flexion 4- * 4+  Knee extension 4- 4+  Ankle dorsiflexion 5 5  Ankle plantarflexion    Ankle inversion    Ankle eversion     (Blank rows = not tested)   *=painful  LUMBAR SPECIAL TESTS:    FUNCTIONAL TESTS:  30 seconds chair stand test 2 minute walk test: Next Session    30 sec chair stand test: 7 STS, inc in throbbing pain in RLE GAIT: Distance walked: 11' Assistive device utilized: None Level of assistance: Complete Independence Comments: Antalgic-like, dec trunk rotation, dec hip ext bilaterally,   TREATMENT DATE:  02/15/2024  -Supine LTR x 30 -Prone on elbow x 3' with cues for deep breathing- - : 311ft-anterior pelvic tilt noted during ambulation -Supine R sciatic nerve glide 2 x10 -Seated sciatic nerve glide x 8 -Seated chair pushup with lumbar spine decompression activity 2 x 5 - reduction in pain  -TA bracing supine 5x10''  02/08/24: PT Eval and HEP  PATIENT EDUCATION:  Education details: PT evaluation, objective findings, POC, Importance of HEP, Precautions, Clinic policies  Person educated: Patient Education method: Explanation and Demonstration Education comprehension: verbalized understanding and returned demonstration  HOME EXERCISE PROGRAM: Access Code: [email or web address] URL: [email or web address] Date: 02/15/2024 Prepared by: Irene Mannheim  Exercises - Supine  Lower Trunk Rotation  - 2 x daily - 7 x weekly - 2 sets - 10 reps - Prone on Elbows Stretch  - 2 x daily - 7 x weekly - 2 sets - 10 reps - 5 hold * to tolerance, to dec RLE sensation, cued to go 50% of total movement - Standing Lumbar Spine Flexion Stretch Counter  - 2 x daily - 7 x weekly - 2 sets - 10 reps  * emphasis on good posture (scap retraction and TA contraction), pt seated - NOT standing - Supine Sciatic Nerve Glide  - 1 x daily - 7 x weekly - 1-2 sets - 10 reps - Seated Slump Nerve Glide  - 1 x daily - 7 x weekly - 1-2 sets - 10 reps - Seated Chair Push Ups  - 1 x daily - 7 x weekly - 3 sets - 10 reps - Supine Transversus Abdominis Bracing - Hands on Stomach  - 1 x daily - 7 x weekly - 3 sets - 10 reps   ASSESSMENT:  CLINICAL IMPRESSION: Pt tolerating treatment session well today. Introduced neural mobility activities and tolerated well with good response. No increase in pain and relief with symptoms in RLE. Pt also give self distraction of lumbar spine for pain modulation with great response as well. Pt's reported experience pain has reduced overall today during activities with less intense pulsing. Pt does demonstrate poor lumbar postural awareness during 2 MWT today, address with beginner core activation activities. . Patient will benefit from continued skilled physical therapy in order to address the above to improve his function and QOL.    OBJECTIVE IMPAIRMENTS: Abnormal gait, decreased activity tolerance, decreased endurance, decreased mobility, difficulty walking, decreased ROM, decreased strength, increased muscle spasms, improper body mechanics, and pain.   ACTIVITY LIMITATIONS: carrying, lifting, sitting, standing, squatting, sleeping, and transfers  PARTICIPATION LIMITATIONS: cleaning, laundry, community activity, occupation, and yard work  PERSONAL FACTORS: Time since onset of injury/illness/exacerbation are also affecting patient's functional outcome.   REHAB  POTENTIAL: Good  CLINICAL DECISION MAKING: Stable/uncomplicated  EVALUATION COMPLEXITY: Low   GOALS: Goals reviewed with patient? No  SHORT TERM GOALS: Target date: 02/22/24 Patient will be independent with performance of HEP to demonstrate adequate self management of symptoms.  Baseline:  Goal status: INITIAL  2.   Patient will report at least a 25% improvement with function or pain overall since beginning PT. Baseline:  Goal status: INITIAL   LONG TERM GOALS: Target date: 03/21/24 Patient will improve Oswestry score by   10  points in order to improve self-perceived disability and overall function.  Baseline: 31/50 Goal status: INITIAL   2.  Patient will report an overall improvement of daily pain of 5/10 or less.  Baseline: 9/10 Goal status: INITIAL   3.  Patient will improve  30 second chair stand   test to  complete at least 10 STS  in order to demonstrate improved LE strength and endurance for work duties. Baseline:  Goal status: INITIAL   4.  Patient will report overall 50% improvement since beginning PT. Baseline:  Goal status: INITIAL   PLAN:  PT FREQUENCY: 2x/week  PT DURATION: 6 weeks  PLANNED INTERVENTIONS:  29562- PT Re-evaluation, 97110-Therapeutic exercises, 97530- Therapeutic activity, [id number]- Neuromuscular re-education, 97535- Self Care, 13086- Manual therapy, [phone]- Gait training, [phone]- Electrical stimulation (manual), Patient/Family education, Balance training, Stair training, Taping, Dry Needling, Joint mobilization, Spinal mobilization, and Moist heat.  PLAN FOR NEXT SESSION: test, Distraction, Decompression exercises, Core/TA strengthening, LE strengthening   10:56 AM, 02/15/24 Gatha Kaska PT, DPT [facility]- [place] [phone] office

## 2024-02-19 ENCOUNTER — Encounter (HOSPITAL_COMMUNITY): Payer: Self-pay

## 2024-02-19 ENCOUNTER — Ambulatory Visit (HOSPITAL_COMMUNITY)

## 2024-02-19 DIAGNOSIS — M5459 Other low back pain: Secondary | ICD-10-CM

## 2024-02-19 DIAGNOSIS — M5416 Radiculopathy, lumbar region: Secondary | ICD-10-CM

## 2024-02-19 NOTE — Therapy (Signed)
 OUTPATIENT PHYSICAL THERAPY THORACOLUMBAR TREATMENT   Patient Name: Taylor Mcclain MRN: [id number] DOB:06-15-1981, 43 y.o., male Today's Date: 02/19/2024  END OF SESSION:  PT End of Session - 02/19/24 1224     Visit Number 3    Number of Visits 8    Date for PT Re-Evaluation 03/07/24    Authorization Type [place] Medicaid [employer]    Authorization Time Period 8v from 02/08/24-04/08/24    Progress Note Due on Visit 8    PT Start Time 1150    PT Stop Time 1225    PT Time Calculation (min) 35 min    Activity Tolerance Patient tolerated treatment well    Behavior During Therapy WFL for tasks assessed/performed            Past Medical History:  Diagnosis Date   Known health problems: none    Past Surgical History:  Procedure Laterality Date   WRIST SURGERY Left    age 84   Patient Active Problem List   Diagnosis Date Noted   Tobacco use disorder 08/02/2018    PCP: Blondell Burgess, PA-C  REFERRING PROVIDER: Jonathon Neighbors, MD  REFERRING DIAG: M54.9 (ICD-10-CM) - Dorsalgia, unspecified  Rationale for Evaluation and Treatment: Rehabilitation  THERAPY DIAG:  Other low back pain  Radiculopathy, lumbar region  ONSET DATE: 7 years ago  SUBJECTIVE:                                                                                                                                                                                           SUBJECTIVE STATEMENT: Pt reporting that he is feeling stiff but overall good. Reported that he was able to walk around [employer] for the first time since starting PT.Aaron Aas  PERTINENT HISTORY:  Back surgery 2 years ago  PAIN:  Are you having pain? Yes: NPRS scale: 9/10 Pain location: Low back and R LE Pain description: Soreness and throbbing pain Aggravating factors: Walking (very short bouts. Ie. From car to [employer] entrance) Relieving factors: Sometimes stretching, sometimes heat or ice   PRECAUTIONS: None  RED FLAGS: None   WEIGHT BEARING  RESTRICTIONS: No  FALLS:  Has patient fallen in last 6 months? No  OCCUPATION: Construction/Carpentry, Full time   PLOF: Independent  PATIENT GOALS: Stop the pain  NEXT MD VISIT: July 29th, 2025  OBJECTIVE:  Note: Objective measures were completed at Evaluation unless otherwise noted.  DIAGNOSTIC FINDINGS:  IMPRESSION: Lumbar spondylosis, as outlined and with findings most notably as follows.   At L5-S1, there is mild-to-moderate disc degeneration. A sizable right center/subarticular caudally migrated disc extrusion contributes to severe right subarticular stenosis. The disc  extrusion encroaches upon multiple descending right-sided nerve roots (most notably the descending right S1 nerve root). Mild/moderate bilateral neural foraminal narrowing.   At L4-L5, there is mild-to-moderate disc degeneration. Multifactorial mild relative left subarticular narrowing (without appreciable nerve root impingement). Bilateral neural foraminal narrowing (moderate right, moderate/severe left).   Mild lumbar dextrocurvature.  PATIENT SURVEYS:  Modified Oswestry Low Back Pain Disability Questionnaire: 31 / 50 = 62.0 %  COGNITION: Overall cognitive status: Within functional limits for tasks assessed     SENSATION: WFL  POSTURE: No Significant postural limitations  PALPATION: TTP throughout lumbar spine, Reports inc pain/discomfort with CPA to L1-L5 Palpation of R piriformis Inc sensation/discomfort down RLE Stiffness t/o thoracic spine Tightness t/o paraspinals, more notable in thoracic>lumbar   LUMBAR ROM:   AROM eval  Flexion WFL (just above toes), feels good like stretch  Extension 25% avail, * in low back and RLE  Right lateral flexion To knee joint, *  Left lateral flexion To knee joint, * worse than R  Right rotation 75% avail, *  Left rotation 75% avail,  * worse than R   (Blank rows = not tested)   *=painful  LOWER EXTREMITY ROM:     Active  Right eval  Left eval  Hip flexion    Hip extension    Hip abduction    Hip adduction    Hip internal rotation    Hip external rotation    Knee flexion    Knee extension    Ankle dorsiflexion    Ankle plantarflexion    Ankle inversion    Ankle eversion     (Blank rows = not tested)  LOWER EXTREMITY MMT:    MMT Right eval Left eval  Hip flexion 4- 5  Hip extension    Hip abduction    Hip adduction    Hip internal rotation    Hip external rotation    Knee flexion 4- * 4+  Knee extension 4- 4+  Ankle dorsiflexion 5 5  Ankle plantarflexion    Ankle inversion    Ankle eversion     (Blank rows = not tested)   *=painful  LUMBAR SPECIAL TESTS:    FUNCTIONAL TESTS:  30 seconds chair stand test 2 minute walk test: Next Session    30 sec chair stand test: 7 STS, inc in throbbing pain in RLE GAIT: Distance walked: 58' Assistive device utilized: None Level of assistance: Complete Independence Comments: Antalgic-like, dec trunk rotation, dec hip ext bilaterally,   TREATMENT DATE:  02/19/2024  -40x LTR -20x KTC in hooklying -Supine R sciatic nerve glide 2 x 15 -Isometric deadbug 10x10'' -Seated sciatic nerve glide 2x10 -Side stepping with GTB 2 x 1' -Seated chair pushup with lumbar spine decompression activity 2 x 10 - reduction in pain    02/15/2024  -Supine LTR x 30 -Prone on elbow x 3' with cues for deep breathing- - : 365ft-anterior pelvic tilt noted during ambulation -Supine R sciatic nerve glide 2 x10 -Seated sciatic nerve glide x 8 -Seated chair pushup with lumbar spine decompression activity 2 x 5 - reduction in pain  -TA bracing supine 5x10''  02/08/24: PT Eval and HEP  PATIENT EDUCATION:  Education details: PT evaluation, objective findings, POC, Importance of HEP, Precautions, Clinic policies  Person educated: Patient Education  method: Explanation and Demonstration Education comprehension: verbalized understanding and returned demonstration  HOME EXERCISE PROGRAM: Access Code: [email or web address] URL: [email or web address] Date: 02/15/2024 Prepared by: Irene Mannheim  Exercises - Supine Lower Trunk Rotation  - 2 x daily - 7 x weekly - 2 sets - 10 reps - Prone on Elbows Stretch  - 2 x daily - 7 x weekly - 2 sets - 10 reps - 5 hold * to tolerance, to dec RLE sensation, cued to go 50% of total movement - Standing Lumbar Spine Flexion Stretch Counter  - 2 x daily - 7 x weekly - 2 sets - 10 reps  * emphasis on good posture (scap retraction and TA contraction), pt seated - NOT standing - Supine Sciatic Nerve Glide  - 1 x daily - 7 x weekly - 1-2 sets - 10 reps - Seated Slump Nerve Glide  - 1 x daily - 7 x weekly - 1-2 sets - 10 reps - Seated Chair Push Ups  - 1 x daily - 7 x weekly - 3 sets - 10 reps - Supine Transversus Abdominis Bracing - Hands on Stomach  - 1 x daily - 7 x weekly - 3 sets - 10 reps   ASSESSMENT:  CLINICAL IMPRESSION: Pt tolerating treatment session well today. Added gluteal standing activities with TA bracing. Difficulty understanding pt's presentation as pt is not reporting pain but stretching and inconsistency are noted during subjective report. Pt demonstrating poor attention to tasks with prolonged responses. Pt continued to respond well to decompression and distraction interventions. Patient will benefit from continued skilled physical therapy in order to address the above to improve his function and QOL.    OBJECTIVE IMPAIRMENTS: Abnormal gait, decreased activity tolerance, decreased endurance, decreased mobility, difficulty walking, decreased ROM, decreased strength, increased muscle spasms, improper body mechanics, and pain.   ACTIVITY LIMITATIONS: carrying, lifting, sitting, standing, squatting, sleeping, and transfers  PARTICIPATION LIMITATIONS: cleaning, laundry, community activity,  occupation, and yard work  PERSONAL FACTORS: Time since onset of injury/illness/exacerbation are also affecting patient's functional outcome.   REHAB POTENTIAL: Good  CLINICAL DECISION MAKING: Stable/uncomplicated  EVALUATION COMPLEXITY: Low   GOALS: Goals reviewed with patient? No  SHORT TERM GOALS: Target date: 02/22/24 Patient will be independent with performance of HEP to demonstrate adequate self management of symptoms.  Baseline:  Goal status: INITIAL  2.   Patient will report at least a 25% improvement with function or pain overall since beginning PT. Baseline:  Goal status: INITIAL   LONG TERM GOALS: Target date: 03/21/24 Patient will improve Oswestry score by   10  points in order to improve self-perceived disability and overall function.  Baseline: 31/50 Goal status: INITIAL   2.  Patient will report an overall improvement of daily pain of 5/10 or less.  Baseline: 9/10 Goal status: INITIAL   3.  Patient will improve  30 second chair stand   test to  complete at least 10 STS  in order to demonstrate improved LE strength and endurance for work duties. Baseline:  Goal status: INITIAL   4.  Patient will report overall 50% improvement since beginning PT. Baseline:  Goal status: INITIAL   PLAN:  PT FREQUENCY: 2x/week  PT DURATION: 6 weeks  PLANNED INTERVENTIONS: 97164- PT Re-evaluation, 97110-Therapeutic exercises, 97530- Therapeutic activity, [id number]- Neuromuscular re-education, 97535- Self Care, 54098- Manual therapy, [id number]- Gait training, [phone]- Electrical stimulation (  manual), Patient/Family education, Balance training, Stair training, Taping, Dry Needling, Joint mobilization, Spinal mobilization, and Moist heat.  PLAN FOR NEXT SESSION:Distraction, Decompression exercises, Core/TA strengthening, LE strengthening   12:25 PM, 02/19/24 Gatha Kaska PT, DPT [facility]- [place] [phone] office

## 2024-02-24 ENCOUNTER — Encounter (HOSPITAL_COMMUNITY): Admitting: Physical Therapy

## 2024-02-24 ENCOUNTER — Telehealth (HOSPITAL_COMMUNITY): Payer: Self-pay | Admitting: Physical Therapy

## 2024-02-24 NOTE — Telephone Encounter (Signed)
Pt did not show for appt.  Called and left VM regarding missed appt and reminder for next scheduled one.  Teena Irani, PTA/CLT [place] Ph: [phone]

## 2024-03-02 ENCOUNTER — Telehealth (HOSPITAL_COMMUNITY): Payer: Self-pay | Admitting: Physical Therapy

## 2024-03-02 ENCOUNTER — Encounter (HOSPITAL_COMMUNITY): Admitting: Physical Therapy

## 2024-03-02 NOTE — Telephone Encounter (Signed)
 Second NS today.  Will discharge if does not show for final appt.  All remaining ones cancelled at this time due to NS policy.  Greig KATHEE Fuse, PTA/CLT [facility] Out[facility] Rehabilitation [facility] Ph: [phone]

## 2024-03-08 ENCOUNTER — Encounter (HOSPITAL_COMMUNITY): Admitting: Physical Therapy

## 2024-03-08 ENCOUNTER — Telehealth (HOSPITAL_COMMUNITY): Payer: Self-pay | Admitting: Physical Therapy

## 2024-03-08 NOTE — Therapy (Signed)
 PHYSICAL THERAPY DISCHARGE SUMMARY  Visits from Start of Care: 3  Current functional level related to goals / functional outcomes: See last note on 6/13   Remaining deficits: See last note on 6/13     Education / Equipment: See last note on 6/13     Patient agrees to discharge. Patient goals were not met. Patient is being discharged due to not returning since the last visit. Patient discharged per No Show policy.  1:19 PM, 03/08/24 Rosaria Settler, PT, DPT [facility] - [place]

## 2024-03-08 NOTE — Telephone Encounter (Signed)
 Pt did not show for appt.  Called and spoke to patient to inform him of his discharge due to our NS policy.  Pt was informed he would need a new order to return.  Pt with no questions or concerns.   Greig KATHEE Fuse, PTA/CLT [facility] Out[facility] Rehabilitation [facility] Ph: [phone]

## 2024-03-10 ENCOUNTER — Encounter (HOSPITAL_COMMUNITY)

## 2024-03-15 ENCOUNTER — Encounter (HOSPITAL_COMMUNITY)

## 2024-03-17 ENCOUNTER — Encounter (HOSPITAL_COMMUNITY)

## 2024-03-21 ENCOUNTER — Encounter (HOSPITAL_COMMUNITY)

## 2024-03-23 ENCOUNTER — Encounter (HOSPITAL_COMMUNITY)

## 2024-03-29 ENCOUNTER — Encounter (HOSPITAL_COMMUNITY): Admitting: Physical Therapy

## 2024-03-31 ENCOUNTER — Encounter (HOSPITAL_COMMUNITY)

## 2024-04-24 ENCOUNTER — Emergency Department (HOSPITAL_COMMUNITY)

## 2024-04-24 ENCOUNTER — Encounter (HOSPITAL_COMMUNITY): Payer: Self-pay | Admitting: Emergency Medicine

## 2024-04-24 ENCOUNTER — Emergency Department (HOSPITAL_COMMUNITY)
Admission: EM | Admit: 2024-04-24 | Discharge: 2024-04-24 | Disposition: A | Discharge status: Home / Self Care | Attending: Emergency Medicine | Admitting: Emergency Medicine

## 2024-04-24 ENCOUNTER — Other Ambulatory Visit: Payer: Self-pay

## 2024-04-24 DIAGNOSIS — M51369 Other intervertebral disc degeneration, lumbar region without mention of lumbar back pain or lower extremity pain: Secondary | ICD-10-CM | POA: Diagnosis not present

## 2024-04-24 DIAGNOSIS — M545 Low back pain, unspecified: Secondary | ICD-10-CM | POA: Diagnosis present

## 2024-04-24 DIAGNOSIS — M5441 Lumbago with sciatica, right side: Secondary | ICD-10-CM | POA: Insufficient documentation

## 2024-04-24 MED ORDER — METHOCARBAMOL 750 MG PO TABS: 750.0000 mg | ORAL_TABLET | Freq: Three times a day (TID) | ORAL | 0 refills | Status: AC

## 2024-04-24 MED ORDER — OXYCODONE-ACETAMINOPHEN 5-325 MG PO TABS
1.0000 | ORAL_TABLET | Freq: Once | ORAL | Status: AC
  Administered 2024-04-24: 1 via ORAL
  Filled 2024-04-24: qty 1

## 2024-04-24 MED ORDER — NAPROXEN 500 MG PO TABS: 500.0000 mg | ORAL_TABLET | Freq: Two times a day (BID) | ORAL | 0 refills | Status: AC

## 2024-04-24 MED ORDER — KETOROLAC TROMETHAMINE 15 MG/ML IJ SOLN
15.0000 mg | Freq: Once | INTRAMUSCULAR | Status: AC
  Administered 2024-04-24: 15 mg via INTRAMUSCULAR
  Filled 2024-04-24: qty 1

## 2024-04-24 MED ORDER — METRONIDAZOLE 500 MG PO TABS
2000.0000 mg | ORAL_TABLET | Freq: Once | ORAL | Status: AC
  Administered 2024-04-24: 2000 mg via ORAL
  Filled 2024-04-24: qty 4

## 2024-04-24 MED ORDER — DEXAMETHASONE SODIUM PHOSPHATE 10 MG/ML IJ SOLN
10.0000 mg | Freq: Once | INTRAMUSCULAR | Status: AC
  Administered 2024-04-24: 10 mg via INTRAMUSCULAR
  Filled 2024-04-24: qty 1

## 2024-04-24 NOTE — Discharge Instructions (Signed)
 Please follow-up closely with neurosurgery and spine on an outpatient basis.  Return to emergency department immediately for any new or worsening symptoms.

## 2024-04-24 NOTE — ED Provider Notes (Signed)
 [place] EMERGENCY DEPARTMENT AT [facility] Provider Note   CSN: [id number] Arrival date & time: 04/24/24  1756     Patient presents with: Back Pain   Taylor Mcclain is a 43 y.o. male.   Patient is a 43 year old male who presents to the Emergency Department with a chief complaint of lower back pain which has been chronic in nature but became worse over the past week.  Patient does note that he works a physical job and does a lot of heavy lifting.  He notes he has had no recent falls or blunt trauma.  He denies any urinary bowel incontinence, saddle paresthesias, gait changes, fever, chills, history of IV drug use, show HIV, history of cancer, history chronic steroid use.  He has had no abdominal pain, nausea, vomiting, diarrhea, constipation.  He denies any dysuria or hematuria.  He does asked to be treated for trichomonas at this point as he has been exposed to this.  He notes he is otherwise asymptomatic.   Back Pain      Prior to Admission medications   Medication Sig Start Date End Date Taking? Authorizing Provider  methocarbamol  (ROBAXIN ) 750 MG tablet Take 1 tablet (750 mg total) by mouth 3 (three) times daily. 04/24/24  Yes Daralene Bruckner D, PA-C  naproxen  (NAPROSYN ) 500 MG tablet Take 1 tablet (500 mg total) by mouth 2 (two) times daily. 04/24/24  Yes Daralene Bruckner D, PA-C  esomeprazole (NEXIUM) 20 MG capsule Take 20 mg by mouth daily at 12 noon.    [provider]    Allergies: Patient has no known allergies.    Review of Systems  Musculoskeletal:  Positive for back pain.  All other systems reviewed and are negative.   Updated Vital Signs BP 111/76 (BP Location: Right Arm)   Pulse 83   Temp 98.6 F (37 C) (Oral)   Ht 5' 11 (1.803 m)   Wt 86.6 kg   SpO2 96%   BMI 26.64 kg/m   Physical Exam Vitals and nursing note reviewed.  Constitutional:      Appearance: Normal appearance.  HENT:     Head: Normocephalic and atraumatic.   Eyes:     Extraocular Movements: Extraocular movements intact.     Conjunctiva/sclera: Conjunctivae normal.     Pupils: Pupils are equal, round, and reactive to light.  Cardiovascular:     Rate and Rhythm: Normal rate and regular rhythm.     Pulses: Normal pulses.     Heart sounds: Normal heart sounds. No murmur heard.    No gallop.  Pulmonary:     Effort: Pulmonary effort is normal. No respiratory distress.     Breath sounds: Normal breath sounds. No stridor. No wheezing, rhonchi or rales.  Abdominal:     General: Abdomen is flat. Bowel sounds are normal. There is no distension.     Palpations: Abdomen is soft.     Tenderness: There is no abdominal tenderness. There is no guarding.  Musculoskeletal:        General: No swelling, deformity or signs of injury. Normal range of motion.     Cervical back: Normal range of motion and neck supple. No rigidity or tenderness.     Comments: Tender to palpation noted over lower lumbar spine, tender to palpation along paraspinous muscles, no step-off or deformity, nontender palpation over thoracic spine, no CVA tenderness, nontender palpation or remainder of lumbar joints, sensation intact distally, full range of motion noted throughout  Skin:  General: Skin is warm and dry.     Findings: No bruising or rash.  Neurological:     General: No focal deficit present.     Mental Status: He is alert and oriented to person, place, and time. Mental status is at baseline.     Cranial Nerves: No cranial nerve deficit.     Sensory: No sensory deficit.     Motor: No weakness.     Coordination: Coordination normal.     Gait: Gait normal.     Comments: Extension bilateral great toes intact, extension and flexion at hips intact  Psychiatric:        Mood and Affect: Mood normal.        Behavior: Behavior normal.        Thought Content: Thought content normal.        Judgment: Judgment normal.     (all labs ordered are listed, but only abnormal results  are displayed) Labs Reviewed - No data to display  EKG: None  Radiology: DG Lumbar Spine Complete Result Date: 04/24/2024 CLINICAL DATA:  Low back pain. EXAM: LUMBAR SPINE - COMPLETE 4+ VIEW COMPARISON:  09/19/2021 FINDINGS: Five non-rib-bearing lumbar vertebra. Trace retrolisthesis of L5 on S1. Otherwise normal alignment. Disc space narrowing at L4-L5 and L5-S1. No evidence of fracture, bony destructive change or pars defects. Sacroiliac joints are congruent. IMPRESSION: Degenerative disc disease at L4-L5 and L5-S1. Electronically Signed   By: Andrea Gasman M.D.   On: 04/24/2024 18:49     Procedures   Medications Ordered in the ED  ketorolac  (TORADOL ) 15 MG/ML injection 15 mg (15 mg Intramuscular Given 04/24/24 1840)  oxyCODONE -acetaminophen  (PERCOCET/ROXICET) 5-325 MG Taylor tablet 1 tablet (1 tablet Oral Given 04/24/24 1838)  dexamethasone  (DECADRON ) injection 10 mg (10 mg Intramuscular Given 04/24/24 1840)  metroNIDAZOLE  (FLAGYL ) tablet 2,000 mg (2,000 mg Oral Given 04/24/24 1838)                                    Medical Decision Making Patient is doing well at this time and is stable for discharge home.  Discussed with patient x-ray does demonstrate some degenerative disc disease.  Patient is TUNAFISH negative and has no concerning neurological deficits.  Do not suspect on etiology such as cauda equina syndrome, vertebral osteomyelitis, epidural abscess.  Patient is abdominal exam is benign with no focal tenderness throughout.  Do not suspect an acute abdominal surgical process.  Do not suspect any further advanced imaging of the back including MRI is warranted on an emergent basis.  Did discuss the need for close follow-up with a spine specialist on an outpatient basis.  Continue symptomatic treatment and outpatient basis was discussed and provided.  Strict return precautions were provided for any new or worsening symptoms.  Patient voiced understand to the plan and had no additional  questions.  Amount and/or Complexity of Data Reviewed Radiology: ordered.  Risk Prescription drug management.        Final diagnoses:  Acute bilateral low back pain with right-sided sciatica  Degeneration of intervertebral disc of lumbar region, unspecified whether pain present    ED Discharge Orders          Ordered    naproxen  (NAPROSYN ) 500 MG tablet  2 times daily        04/24/24 1910    methocarbamol  (ROBAXIN ) 750 MG tablet  3 times daily  04/24/24 1910               Daralene Lonni JONETTA DEVONNA 04/24/24 RITA Suzette Pac, MD 04/27/24 1114

## 2024-04-24 NOTE — ED Triage Notes (Signed)
 Pt to the ED with complaints of back pain for the last 3 days.

## 2024-04-29 ENCOUNTER — Encounter: Payer: Self-pay | Admitting: Radiology

## 2024-07-11 ENCOUNTER — Encounter: Payer: Self-pay | Admitting: Radiology

## 2024-09-05 ENCOUNTER — Emergency Department (HOSPITAL_COMMUNITY): Admission: EM | Admit: 2024-09-05 | Discharge: 2024-09-05 | Disposition: A | Discharge status: Home / Self Care

## 2024-09-05 ENCOUNTER — Encounter (HOSPITAL_COMMUNITY): Payer: Self-pay

## 2024-09-05 ENCOUNTER — Other Ambulatory Visit: Payer: Self-pay

## 2024-09-05 DIAGNOSIS — M549 Dorsalgia, unspecified: Secondary | ICD-10-CM | POA: Diagnosis present

## 2024-09-05 DIAGNOSIS — M5441 Lumbago with sciatica, right side: Secondary | ICD-10-CM | POA: Insufficient documentation

## 2024-09-05 HISTORY — DX: Sciatica, unspecified side: M54.30

## 2024-09-05 MED ORDER — PREDNISONE 10 MG (21) PO TBPK: ORAL_TABLET | Freq: Every day | ORAL | 0 refills | Status: AC

## 2024-09-05 MED ORDER — LIDOCAINE 5 % EX PTCH: 1.0000 | MEDICATED_PATCH | CUTANEOUS | 0 refills | Status: AC

## 2024-09-05 MED ORDER — LIDOCAINE 5 % EX PTCH
1.0000 | MEDICATED_PATCH | CUTANEOUS | Status: DC
  Administered 2024-09-05: 1 via TRANSDERMAL
  Filled 2024-09-05: qty 1

## 2024-09-05 MED ORDER — METHOCARBAMOL 500 MG PO TABS: 1000.0000 mg | ORAL_TABLET | Freq: Four times a day (QID) | ORAL | 0 refills | Status: AC | PRN

## 2024-09-05 MED ORDER — METHYLPREDNISOLONE SODIUM SUCC 125 MG IJ SOLR
125.0000 mg | Freq: Once | INTRAMUSCULAR | Status: AC
  Administered 2024-09-05: 125 mg via INTRAMUSCULAR
  Filled 2024-09-05: qty 2

## 2024-09-05 MED ORDER — NAPROXEN 500 MG PO TABS: 500.0000 mg | ORAL_TABLET | Freq: Two times a day (BID) | ORAL | 0 refills | Status: AC

## 2024-09-05 MED ORDER — KETOROLAC TROMETHAMINE 15 MG/ML IJ SOLN
15.0000 mg | Freq: Once | INTRAMUSCULAR | Status: AC
  Administered 2024-09-05: 15 mg via INTRAMUSCULAR
  Filled 2024-09-05: qty 1

## 2024-09-05 NOTE — ED Triage Notes (Signed)
 Pt with hx of sciatica having a flare up x 2 weeks.  Pt was seen at another facility but they did not help him.

## 2024-09-05 NOTE — Discharge Instructions (Signed)
 Take your steroids as prescribed.  You can use your naproxen  as prescribed and take your Robaxin  as needed.  Also use your lidocaine  patches as needed.  You can also add in Tylenol  up to 4 times a day as needed.  Follow-up with your primary care doctor and your neurosurgeon.

## 2024-09-05 NOTE — ED Provider Notes (Signed)
 " [place] EMERGENCY DEPARTMENT AT [facility] Provider Note   CSN: [id number] Arrival date & time: 09/05/24  2053     Patient presents with: Back Pain   Taylor Mcclain is a 43 y.o. male.   43 year old male presents for evaluation of back pain.  He states he has a history of chronic back pain and disc disease.  States over the last couple days he has had worsening of his sciatica pain.  States it radiates down his right lower extremity.  He denies any falls or injury.  Denies any other symptoms or concerns.   Back Pain Associated symptoms: no abdominal pain, no chest pain, no dysuria and no fever        Prior to Admission medications  Medication Sig Start Date End Date Taking? Authorizing Provider  lidocaine  (LIDODERM ) 5 % Place 1 patch onto the skin daily. Remove & Discard patch within 12 hours or as directed by MD 09/05/24  Yes Nicoli Nardozzi L, DO  methocarbamol  (ROBAXIN ) 500 MG tablet Take 2 tablets (1,000 mg total) by mouth every 6 (six) hours as needed for up to 7 days for muscle spasms. 09/05/24 09/12/24 Yes Kimmberly Wisser L, DO  naproxen  (NAPROSYN ) 500 MG tablet Take 1 tablet (500 mg total) by mouth 2 (two) times daily for 7 days. 09/05/24 09/12/24 Yes Chizuko Trine L, DO  predniSONE  (STERAPRED UNI-PAK 21 TAB) 10 MG (21) TBPK tablet Take by mouth daily. Take 6 tabs by mouth daily  for 2 days, then 5 tabs for 2 days, then 4 tabs for 2 days, then 3 tabs for 2 days, 2 tabs for 2 days, then 1 tab by mouth daily for 2 days 09/05/24  Yes Daielle Melcher L, DO  esomeprazole (NEXIUM) 20 MG capsule Take 20 mg by mouth daily at 12 noon.    [provider]  methocarbamol  (ROBAXIN ) 750 MG tablet Take 1 tablet (750 mg total) by mouth 3 (three) times daily. 04/24/24   Daralene Lonni BIRCH, PA-C  naproxen  (NAPROSYN ) 500 MG tablet Take 1 tablet (500 mg total) by mouth 2 (two) times daily. 04/24/24   Daralene Lonni BIRCH, PA-C    Allergies: Patient has no known allergies.     Review of Systems  Constitutional:  Negative for chills and fever.  HENT:  Negative for ear pain and sore throat.   Eyes:  Negative for pain and visual disturbance.  Respiratory:  Negative for cough and shortness of breath.   Cardiovascular:  Negative for chest pain and palpitations.  Gastrointestinal:  Negative for abdominal pain and vomiting.  Genitourinary:  Negative for dysuria and hematuria.  Musculoskeletal:  Positive for back pain. Negative for arthralgias.  Skin:  Negative for color change and rash.  Neurological:  Negative for seizures and syncope.  All other systems reviewed and are negative.   Updated Vital Signs BP 114/71 (BP Location: Right Arm)   Pulse 80   Temp 98.6 F (37 C) (Oral)   Resp 18   Wt 88.5 kg   SpO2 98%   BMI 27.20 kg/m   Physical Exam Vitals and nursing note reviewed.  Constitutional:      General: He is not in acute distress.    Appearance: Normal appearance. He is well-developed. He is not ill-appearing.  HENT:     Head: Normocephalic and atraumatic.  Eyes:     Conjunctiva/sclera: Conjunctivae normal.  Cardiovascular:     Rate and Rhythm: Normal rate and regular rhythm.  Heart sounds: No murmur heard. Pulmonary:     Effort: Pulmonary effort is normal. No respiratory distress.     Breath sounds: Normal breath sounds.  Abdominal:     Palpations: Abdomen is soft.     Tenderness: There is no abdominal tenderness.  Musculoskeletal:        General: No swelling.     Cervical back: Neck supple.  Skin:    General: Skin is warm and dry.     Capillary Refill: Capillary refill takes less than 2 seconds.  Neurological:     General: No focal deficit present.     Mental Status: He is alert.  Psychiatric:        Mood and Affect: Mood normal.     (all labs ordered are listed, but only abnormal results are displayed) Labs Reviewed - No data to display  EKG: None  Radiology: No results found.   Procedures   Medications Ordered  in the ED  methylPREDNISolone  sodium succinate (SOLU-MEDROL ) 125 mg/2 mL injection 125 mg (has no administration in time range)  ketorolac  (TORADOL ) 15 MG/ML injection 15 mg (has no administration in time range)  lidocaine  (LIDODERM ) 5 % 1 patch (has no administration in time range)                                    Medical Decision Making Patient here for acute on chronic sciatica.  Not currently taking any medication at home.  Will give him Toradol  and Solu-Medrol  here and also lidocaine  patch.  Given prescription for prednisone , naproxen , lidocaine  patches and Robaxin .  Advise close up with his neurosurgeon and primary care doctor and discuss physical therapy with them as well.  Advise return for any new or worsening symptoms and also advised to use Tylenol  as needed for pain.  He feels comfortable to plan to be discharged home.  Problems Addressed: Acute right-sided low back pain with right-sided sciatica: chronic illness or injury with exacerbation, progression, or side effects of treatment  Amount and/or Complexity of Data Reviewed External Data Reviewed: notes.    Details: Prior ED records reviewed and patient seen 04-24-24 for lower back pain  Risk OTC drugs. Prescription drug management.    Final diagnoses:  Acute right-sided low back pain with right-sided sciatica    ED Discharge Orders          Ordered    predniSONE  (STERAPRED UNI-PAK 21 TAB) 10 MG (21) TBPK tablet  Daily        09/05/24 2232    naproxen  (NAPROSYN ) 500 MG tablet  2 times daily        09/05/24 2232    methocarbamol  (ROBAXIN ) 500 MG tablet  Every 6 hours PRN        09/05/24 2232    lidocaine  (LIDODERM ) 5 %  Every 24 hours        09/05/24 2232               Gennaro Duwaine CROME, DO 09/05/24 2255  "

## 2024-10-06 ENCOUNTER — Encounter: Payer: Self-pay | Admitting: Gastroenterology

## 2024-11-02 ENCOUNTER — Ambulatory Visit: Admitting: Gastroenterology
